# Patient Record
Sex: Male | Born: 1960 | Race: White | Hispanic: No | Marital: Married
Health system: Southern US, Community
[De-identification: ages and names within clinical notes are randomized; demographics above are authoritative.]

## PROBLEM LIST (undated history)

## (undated) DIAGNOSIS — R569 Unspecified convulsions: Secondary | ICD-10-CM

## (undated) DIAGNOSIS — R42 Dizziness and giddiness: Secondary | ICD-10-CM

## (undated) DIAGNOSIS — F3181 Bipolar II disorder: Secondary | ICD-10-CM

## (undated) DIAGNOSIS — M1990 Unspecified osteoarthritis, unspecified site: Secondary | ICD-10-CM

## (undated) DIAGNOSIS — F419 Anxiety disorder, unspecified: Secondary | ICD-10-CM

## (undated) DIAGNOSIS — F1910 Other psychoactive substance abuse, uncomplicated: Secondary | ICD-10-CM

## (undated) DIAGNOSIS — R51 Headache: Secondary | ICD-10-CM

## (undated) HISTORY — DX: Dizziness and giddiness: R42

## (undated) HISTORY — DX: Headache: R51

---

## 2003-09-30 ENCOUNTER — Encounter: Admission: RE | Admit: 2003-09-30 | Discharge: 2003-12-29 | Payer: Self-pay | Admitting: Anesthesiology

## 2005-08-03 ENCOUNTER — Ambulatory Visit (HOSPITAL_COMMUNITY): Admission: RE | Admit: 2005-08-03 | Discharge: 2005-08-03 | Payer: Self-pay | Admitting: Family Medicine

## 2009-01-11 ENCOUNTER — Emergency Department (HOSPITAL_COMMUNITY): Admission: EM | Admit: 2009-01-11 | Discharge: 2009-01-11 | Payer: Self-pay | Admitting: Emergency Medicine

## 2009-07-21 ENCOUNTER — Inpatient Hospital Stay (HOSPITAL_COMMUNITY): Admission: EM | Admit: 2009-07-21 | Discharge: 2009-08-01 | Payer: Self-pay | Admitting: Emergency Medicine

## 2009-09-18 ENCOUNTER — Encounter: Admission: RE | Admit: 2009-09-18 | Discharge: 2009-11-03 | Payer: Self-pay | Admitting: Orthopedic Surgery

## 2010-05-21 ENCOUNTER — Emergency Department (HOSPITAL_COMMUNITY): Admission: EM | Admit: 2010-05-21 | Discharge: 2010-05-21 | Source: Home / Self Care | Admitting: Emergency Medicine

## 2010-06-01 ENCOUNTER — Encounter
Admission: RE | Admit: 2010-06-01 | Discharge: 2010-06-01 | Payer: Self-pay | Source: Home / Self Care | Attending: Orthopedic Surgery | Admitting: Orthopedic Surgery

## 2010-08-10 LAB — CBC
HCT: 29.2 % — ABNORMAL LOW (ref 39.0–52.0)
HCT: 30.6 % — ABNORMAL LOW (ref 39.0–52.0)
HCT: 31.6 % — ABNORMAL LOW (ref 39.0–52.0)
HCT: 32.4 % — ABNORMAL LOW (ref 39.0–52.0)
HCT: 36.1 % — ABNORMAL LOW (ref 39.0–52.0)
Hemoglobin: 10.6 g/dL — ABNORMAL LOW (ref 13.0–17.0)
Hemoglobin: 10.9 g/dL — ABNORMAL LOW (ref 13.0–17.0)
Hemoglobin: 11.2 g/dL — ABNORMAL LOW (ref 13.0–17.0)
Hemoglobin: 11.5 g/dL — ABNORMAL LOW (ref 13.0–17.0)
Hemoglobin: 12.4 g/dL — ABNORMAL LOW (ref 13.0–17.0)
Hemoglobin: 14.2 g/dL (ref 13.0–17.0)
Hemoglobin: 17.6 g/dL — ABNORMAL HIGH (ref 13.0–17.0)
MCHC: 33.7 g/dL (ref 30.0–36.0)
MCHC: 34.3 g/dL (ref 30.0–36.0)
MCHC: 34.5 g/dL (ref 30.0–36.0)
MCHC: 34.8 g/dL (ref 30.0–36.0)
MCV: 98 fL (ref 78.0–100.0)
MCV: 98.1 fL (ref 78.0–100.0)
MCV: 98.2 fL (ref 78.0–100.0)
MCV: 98.5 fL (ref 78.0–100.0)
MCV: 98.8 fL (ref 78.0–100.0)
Platelets: 165 10*3/uL (ref 150–400)
Platelets: 172 10*3/uL (ref 150–400)
Platelets: 212 10*3/uL (ref 150–400)
Platelets: 237 10*3/uL (ref 150–400)
Platelets: 283 10*3/uL (ref 150–400)
Platelets: 304 10*3/uL (ref 150–400)
RBC: 3.06 MIL/uL — ABNORMAL LOW (ref 4.22–5.81)
RBC: 3.12 MIL/uL — ABNORMAL LOW (ref 4.22–5.81)
RBC: 3.22 MIL/uL — ABNORMAL LOW (ref 4.22–5.81)
RBC: 3.3 MIL/uL — ABNORMAL LOW (ref 4.22–5.81)
RBC: 3.66 MIL/uL — ABNORMAL LOW (ref 4.22–5.81)
RBC: 4.2 MIL/uL — ABNORMAL LOW (ref 4.22–5.81)
RDW: 13.5 % (ref 11.5–15.5)
RDW: 14.1 % (ref 11.5–15.5)
RDW: 14.5 % (ref 11.5–15.5)
WBC: 12.8 10*3/uL — ABNORMAL HIGH (ref 4.0–10.5)
WBC: 17 10*3/uL — ABNORMAL HIGH (ref 4.0–10.5)
WBC: 22.3 10*3/uL — ABNORMAL HIGH (ref 4.0–10.5)
WBC: 8.2 10*3/uL (ref 4.0–10.5)
WBC: 8.9 10*3/uL (ref 4.0–10.5)

## 2010-08-10 LAB — POCT I-STAT 3, ART BLOOD GAS (G3+)
O2 Saturation: 93 %
O2 Saturation: 93 %
TCO2: 25 mmol/L (ref 0–100)
pCO2 arterial: 38 mmHg (ref 35.0–45.0)
pCO2 arterial: 53.3 mmHg — ABNORMAL HIGH (ref 35.0–45.0)
pH, Arterial: 7.301 — ABNORMAL LOW (ref 7.350–7.450)
pO2, Arterial: 81 mmHg (ref 80.0–100.0)

## 2010-08-10 LAB — COMPREHENSIVE METABOLIC PANEL
ALT: 27 U/L (ref 0–53)
ALT: 36 U/L (ref 0–53)
AST: 21 U/L (ref 0–37)
AST: 25 U/L (ref 0–37)
Albumin: 2.4 g/dL — ABNORMAL LOW (ref 3.5–5.2)
Albumin: 3.7 g/dL (ref 3.5–5.2)
Alkaline Phosphatase: 101 U/L (ref 39–117)
Alkaline Phosphatase: 66 U/L (ref 39–117)
BUN: 8 mg/dL (ref 6–23)
CO2: 20 mEq/L (ref 19–32)
CO2: 24 mEq/L (ref 19–32)
CO2: 24 mEq/L (ref 19–32)
Calcium: 8.2 mg/dL — ABNORMAL LOW (ref 8.4–10.5)
Calcium: 8.9 mg/dL (ref 8.4–10.5)
Chloride: 106 mEq/L (ref 96–112)
Chloride: 111 mEq/L (ref 96–112)
Creatinine, Ser: 0.72 mg/dL (ref 0.4–1.5)
Creatinine, Ser: 1.1 mg/dL (ref 0.4–1.5)
GFR calc Af Amer: >60 mL/min (ref >60)
GFR calc Af Amer: >60 mL/min (ref >60)
GFR calc non Af Amer: >60 mL/min (ref >60)
Glucose, Bld: 107 mg/dL — ABNORMAL HIGH (ref 70–99)
Glucose, Bld: 134 mg/dL — ABNORMAL HIGH (ref 70–99)
Sodium: 141 mEq/L (ref 135–145)
Total Bilirubin: 0.3 mg/dL (ref 0.3–1.2)
Total Bilirubin: 0.6 mg/dL (ref 0.3–1.2)
Total Protein: 5.8 g/dL — ABNORMAL LOW (ref 6.0–8.3)
Total Protein: 7.3 g/dL (ref 6.0–8.3)

## 2010-08-10 LAB — GLUCOSE, CAPILLARY
Glucose-Capillary: 103 mg/dL — ABNORMAL HIGH (ref 70–99)
Glucose-Capillary: 122 mg/dL — ABNORMAL HIGH (ref 70–99)
Glucose-Capillary: 126 mg/dL — ABNORMAL HIGH (ref 70–99)
Glucose-Capillary: 129 mg/dL — ABNORMAL HIGH (ref 70–99)
Glucose-Capillary: 134 mg/dL — ABNORMAL HIGH (ref 70–99)
Glucose-Capillary: 134 mg/dL — ABNORMAL HIGH (ref 70–99)
Glucose-Capillary: 136 mg/dL — ABNORMAL HIGH (ref 70–99)
Glucose-Capillary: 141 mg/dL — ABNORMAL HIGH (ref 70–99)
Glucose-Capillary: 141 mg/dL — ABNORMAL HIGH (ref 70–99)
Glucose-Capillary: 147 mg/dL — ABNORMAL HIGH (ref 70–99)
Glucose-Capillary: 148 mg/dL — ABNORMAL HIGH (ref 70–99)
Glucose-Capillary: 155 mg/dL — ABNORMAL HIGH (ref 70–99)

## 2010-08-10 LAB — HEMOGLOBIN A1C: Hgb A1c MFr Bld: 5.6 % (ref 4.6–6.1)

## 2010-08-10 LAB — BASIC METABOLIC PANEL
BUN: 4 mg/dL — ABNORMAL LOW (ref 6–23)
BUN: 6 mg/dL (ref 6–23)
BUN: 7 mg/dL (ref 6–23)
BUN: 7 mg/dL (ref 6–23)
BUN: 8 mg/dL (ref 6–23)
BUN: 8 mg/dL (ref 6–23)
CO2: 21 mEq/L (ref 19–32)
CO2: 26 mEq/L (ref 19–32)
CO2: 29 mEq/L (ref 19–32)
CO2: 31 mEq/L (ref 19–32)
Calcium: 7.8 mg/dL — ABNORMAL LOW (ref 8.4–10.5)
Calcium: 8.1 mg/dL — ABNORMAL LOW (ref 8.4–10.5)
Calcium: 8.1 mg/dL — ABNORMAL LOW (ref 8.4–10.5)
Calcium: 8.3 mg/dL — ABNORMAL LOW (ref 8.4–10.5)
Chloride: 104 mEq/L (ref 96–112)
Chloride: 105 mEq/L (ref 96–112)
Chloride: 110 mEq/L (ref 96–112)
Creatinine, Ser: 0.61 mg/dL (ref 0.4–1.5)
Creatinine, Ser: 0.64 mg/dL (ref 0.4–1.5)
Creatinine, Ser: 0.66 mg/dL (ref 0.4–1.5)
Creatinine, Ser: 0.89 mg/dL (ref 0.4–1.5)
GFR calc Af Amer: >60 mL/min (ref >60)
GFR calc Af Amer: >60 mL/min (ref >60)
GFR calc non Af Amer: >60 mL/min (ref >60)
GFR calc non Af Amer: >60 mL/min (ref >60)
GFR calc non Af Amer: >60 mL/min (ref >60)
Glucose, Bld: 114 mg/dL — ABNORMAL HIGH (ref 70–99)
Glucose, Bld: 121 mg/dL — ABNORMAL HIGH (ref 70–99)
Glucose, Bld: 139 mg/dL — ABNORMAL HIGH (ref 70–99)
Glucose, Bld: 173 mg/dL — ABNORMAL HIGH (ref 70–99)
Glucose, Bld: 97 mg/dL (ref 70–99)
Potassium: 3.2 mEq/L — ABNORMAL LOW (ref 3.5–5.1)
Potassium: 3.2 mEq/L — ABNORMAL LOW (ref 3.5–5.1)
Potassium: 4 mEq/L (ref 3.5–5.1)
Sodium: 137 mEq/L (ref 135–145)
Sodium: 142 mEq/L (ref 135–145)
Sodium: 142 mEq/L (ref 135–145)

## 2010-08-10 LAB — EXPECTORATED SPUTUM ASSESSMENT W GRAM STAIN, RFLX TO RESP C

## 2010-08-10 LAB — PROTIME-INR
INR: 1.07 (ref 0.00–1.49)
INR: 1.16 (ref 0.00–1.49)
Prothrombin Time: 13.8 seconds (ref 11.6–15.2)

## 2010-08-10 LAB — CROSSMATCH
ABO/RH(D): B POS
Antibody Screen: NEGATIVE

## 2010-08-10 LAB — APTT: aPTT: 35 seconds (ref 24–37)

## 2010-08-10 LAB — CULTURE, RESPIRATORY W GRAM STAIN: Culture: NORMAL

## 2010-08-10 LAB — PHOSPHORUS: Phosphorus: 2.8 mg/dL (ref 2.3–4.6)

## 2010-08-10 LAB — MAGNESIUM: Magnesium: 2.1 mg/dL (ref 1.5–2.5)

## 2010-08-10 LAB — MRSA PCR SCREENING: MRSA by PCR: NEGATIVE

## 2010-08-10 LAB — POCT I-STAT, CHEM 8
Creatinine, Ser: 0.9 mg/dL (ref 0.4–1.5)
Potassium: 3.5 mEq/L (ref 3.5–5.1)

## 2010-08-22 LAB — POCT I-STAT, CHEM 8
Calcium, Ion: 1.06 mmol/L — ABNORMAL LOW (ref 1.12–1.32)
Hemoglobin: 14.6 g/dL (ref 13.0–17.0)
Sodium: 143 mEq/L (ref 135–145)
TCO2: 18 mmol/L (ref 0–100)

## 2010-08-22 LAB — URINALYSIS, ROUTINE W REFLEX MICROSCOPIC
Ketones, ur: NEGATIVE mg/dL
Nitrite: NEGATIVE
Protein, ur: NEGATIVE mg/dL
Urobilinogen, UA: 0.2 mg/dL (ref 0.0–1.0)
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
## 2011-03-04 ENCOUNTER — Emergency Department (HOSPITAL_COMMUNITY): Payer: 59

## 2011-03-04 ENCOUNTER — Emergency Department (HOSPITAL_COMMUNITY)
Admission: EM | Admit: 2011-03-04 | Discharge: 2011-03-04 | Disposition: A | Discharge status: Home / Self Care | Payer: 59 | Attending: Emergency Medicine | Admitting: Emergency Medicine

## 2011-03-04 DIAGNOSIS — M546 Pain in thoracic spine: Secondary | ICD-10-CM | POA: Insufficient documentation

## 2011-03-04 DIAGNOSIS — R42 Dizziness and giddiness: Secondary | ICD-10-CM | POA: Insufficient documentation

## 2011-03-04 DIAGNOSIS — E119 Type 2 diabetes mellitus without complications: Secondary | ICD-10-CM | POA: Insufficient documentation

## 2011-03-04 DIAGNOSIS — M25519 Pain in unspecified shoulder: Secondary | ICD-10-CM | POA: Insufficient documentation

## 2011-03-04 DIAGNOSIS — R61 Generalized hyperhidrosis: Secondary | ICD-10-CM | POA: Insufficient documentation

## 2011-03-04 DIAGNOSIS — Z79899 Other long term (current) drug therapy: Secondary | ICD-10-CM | POA: Insufficient documentation

## 2011-03-04 DIAGNOSIS — G9050 Complex regional pain syndrome I, unspecified: Secondary | ICD-10-CM | POA: Insufficient documentation

## 2011-03-04 DIAGNOSIS — R55 Syncope and collapse: Secondary | ICD-10-CM | POA: Insufficient documentation

## 2011-03-04 DIAGNOSIS — R079 Chest pain, unspecified: Secondary | ICD-10-CM | POA: Insufficient documentation

## 2011-03-04 LAB — CBC
Hemoglobin: 16.1 g/dL (ref 13.0–17.0)
MCH: 34.6 pg — ABNORMAL HIGH (ref 26.0–34.0)
MCHC: 35.2 g/dL (ref 30.0–36.0)
Platelets: 207 10*3/uL (ref 150–400)
RBC: 4.65 MIL/uL (ref 4.22–5.81)

## 2011-03-04 LAB — POCT I-STAT TROPONIN I
Troponin i, poc: 0.02 ng/mL (ref 0.00–0.08)
Troponin i, poc: 0 ng/mL (ref 0.00–0.08)

## 2011-03-04 LAB — DIFFERENTIAL
Basophils Absolute: 0.1 10*3/uL (ref 0.0–0.1)
Basophils Relative: 1 % (ref 0–1)
Eosinophils Absolute: 0.2 10*3/uL (ref 0.0–0.7)
Monocytes Absolute: 0.6 10*3/uL (ref 0.1–1.0)
Monocytes Relative: 6 % (ref 3–12)
Neutro Abs: 5.3 10*3/uL (ref 1.7–7.7)
Neutrophils Relative %: 58 % (ref 43–77)

## 2011-03-04 LAB — GLUCOSE, CAPILLARY: Glucose-Capillary: 111 mg/dL — ABNORMAL HIGH (ref 70–99)

## 2011-03-04 LAB — COMPREHENSIVE METABOLIC PANEL
AST: 17 U/L (ref 0–37)
Albumin: 4 g/dL (ref 3.5–5.2)
BUN: 10 mg/dL (ref 6–23)
Chloride: 106 mEq/L (ref 96–112)
Creatinine, Ser: 0.86 mg/dL (ref 0.50–1.35)
Total Protein: 6.9 g/dL (ref 6.0–8.3)

## 2011-03-17 ENCOUNTER — Encounter (HOSPITAL_COMMUNITY): Payer: Self-pay | Admitting: Pharmacy Technician

## 2011-03-18 ENCOUNTER — Other Ambulatory Visit (HOSPITAL_COMMUNITY): Payer: Self-pay | Admitting: Orthopedic Surgery

## 2011-03-18 ENCOUNTER — Encounter (HOSPITAL_COMMUNITY): Payer: Self-pay

## 2011-03-18 ENCOUNTER — Encounter (HOSPITAL_COMMUNITY)
Admission: RE | Admit: 2011-03-18 | Discharge: 2011-03-18 | Disposition: A | Discharge status: Home / Self Care | Payer: 59 | Source: Ambulatory Visit | Attending: Orthopedic Surgery | Admitting: Orthopedic Surgery

## 2011-03-18 ENCOUNTER — Ambulatory Visit (HOSPITAL_COMMUNITY): Admission: RE | Admit: 2011-03-18 | Payer: 59 | Source: Ambulatory Visit

## 2011-03-18 DIAGNOSIS — M1712 Unilateral primary osteoarthritis, left knee: Secondary | ICD-10-CM

## 2011-03-18 HISTORY — PX: MANDIBLE FRACTURE SURGERY: SHX706

## 2011-03-18 HISTORY — DX: Anxiety disorder, unspecified: F41.9

## 2011-03-18 HISTORY — PX: KNEE ARTHROSCOPY: SUR90

## 2011-03-18 HISTORY — DX: Unspecified osteoarthritis, unspecified site: M19.90

## 2011-03-18 LAB — URINALYSIS, ROUTINE W REFLEX MICROSCOPIC
Glucose, UA: NEGATIVE mg/dL
Ketones, ur: NEGATIVE mg/dL
Leukocytes, UA: NEGATIVE
Protein, ur: NEGATIVE mg/dL
Urobilinogen, UA: 1 mg/dL (ref 0.0–1.0)

## 2011-03-18 LAB — COMPREHENSIVE METABOLIC PANEL
AST: 16 U/L (ref 0–37)
CO2: 23 mEq/L (ref 19–32)
Calcium: 9.7 mg/dL (ref 8.4–10.5)
Creatinine, Ser: 0.78 mg/dL (ref 0.50–1.35)
GFR calc Af Amer: >90 mL/min (ref >90)
GFR calc non Af Amer: >90 mL/min (ref >90)

## 2011-03-18 LAB — CBC
HCT: 47.9 % (ref 39.0–52.0)
MCH: 34.6 pg — ABNORMAL HIGH (ref 26.0–34.0)
MCHC: 35.1 g/dL (ref 30.0–36.0)
MCV: 98.8 fL (ref 78.0–100.0)
Platelets: 228 10*3/uL (ref 150–400)
RDW: 13.3 % (ref 11.5–15.5)
WBC: 9.5 10*3/uL (ref 4.0–10.5)

## 2011-03-18 LAB — APTT: aPTT: 43 seconds — ABNORMAL HIGH (ref 24–37)

## 2011-03-18 LAB — DIFFERENTIAL
Eosinophils Absolute: 0.1 10*3/uL (ref 0.0–0.7)
Eosinophils Relative: 1 % (ref 0–5)
Lymphocytes Relative: 27 % (ref 12–46)
Lymphs Abs: 2.6 10*3/uL (ref 0.7–4.0)
Monocytes Absolute: 0.4 10*3/uL (ref 0.1–1.0)

## 2011-03-18 LAB — PROTIME-INR: INR: 1 (ref 0.00–1.49)

## 2011-03-18 NOTE — Pre-Procedure Instructions (Signed)
[address]  03/18/2011   Your procedure is scheduled on:  03-29-2011 @ 845am  Report to [facility] [id number] AM.  Call this number if you have problems the morning of surgery: [phone]   Remember:   Do not eat food:After Midnight.  Do not drink clear liquids: 4 Hours before arrival.  Take these medicines the morning of surgery with A SIP OF WATER: xanax, oxycontin,soma,topamax    Do not wear jewelry, make-up or nail polish.  Do not wear lotions, powders, or perfumes. You may wear deodorant.  Do not shave 48 hours prior to surgery.  Do not bring valuables to the hospital.  Contacts, dentures or bridgework may not be worn into surgery.  Leave suitcase in the car. After surgery it may be brought to your room.  For patients admitted to the hospital, checkout time is 11:00 AM the day of discharge.   Patients discharged the day of surgery will not be allowed to drive home.  Name and phone number of your driver Cody Blake  [phone]  Special Instructions: Incentive Spirometry - Practice and bring it with you on the day of surgery.   Please read over the following fact sheets that you were given: Blood Transfusion Information, MRSA Information and Surgical Site Infection Prevention

## 2011-03-19 LAB — URINE CULTURE: Colony Count: 50000

## 2011-03-26 ENCOUNTER — Encounter (HOSPITAL_COMMUNITY): Payer: Self-pay | Admitting: Vascular Surgery

## 2011-03-26 NOTE — Consult Note (Signed)
Anesthesia:  Labs/CXR/EKG/Hx/Meds reviewed.  Plan to proceed.

## 2011-03-28 MED ORDER — CEFAZOLIN SODIUM 1-5 GM-% IV SOLN
1.0000 g | INTRAVENOUS | Status: AC
  Administered 2011-03-29: 1 g via INTRAVENOUS
  Filled 2011-03-28: qty 50

## 2011-03-29 ENCOUNTER — Encounter (HOSPITAL_COMMUNITY): Payer: Self-pay | Admitting: Vascular Surgery

## 2011-03-29 ENCOUNTER — Inpatient Hospital Stay (HOSPITAL_COMMUNITY)
Admission: RE | Admit: 2011-03-29 | Discharge: 2011-03-31 | DRG: 470 | Disposition: A | Discharge status: Home Health | Payer: 59 | Source: Ambulatory Visit | Attending: Orthopedic Surgery | Admitting: Orthopedic Surgery

## 2011-03-29 ENCOUNTER — Inpatient Hospital Stay (HOSPITAL_COMMUNITY): Payer: 59 | Admitting: Vascular Surgery

## 2011-03-29 ENCOUNTER — Encounter (HOSPITAL_COMMUNITY): Payer: Self-pay | Admitting: *Deleted

## 2011-03-29 ENCOUNTER — Encounter (HOSPITAL_COMMUNITY)
Admission: RE | Disposition: A | Discharge status: Home Health | Payer: Self-pay | Source: Ambulatory Visit | Attending: Orthopedic Surgery

## 2011-03-29 DIAGNOSIS — F411 Generalized anxiety disorder: Secondary | ICD-10-CM | POA: Diagnosis present

## 2011-03-29 DIAGNOSIS — Z01818 Encounter for other preprocedural examination: Secondary | ICD-10-CM

## 2011-03-29 DIAGNOSIS — I10 Essential (primary) hypertension: Secondary | ICD-10-CM | POA: Diagnosis present

## 2011-03-29 DIAGNOSIS — M1710 Unilateral primary osteoarthritis, unspecified knee: Principal | ICD-10-CM | POA: Diagnosis present

## 2011-03-29 DIAGNOSIS — F17200 Nicotine dependence, unspecified, uncomplicated: Secondary | ICD-10-CM | POA: Diagnosis present

## 2011-03-29 DIAGNOSIS — D62 Acute posthemorrhagic anemia: Secondary | ICD-10-CM | POA: Diagnosis not present

## 2011-03-29 DIAGNOSIS — Z01812 Encounter for preprocedural laboratory examination: Secondary | ICD-10-CM

## 2011-03-29 HISTORY — PX: TOTAL KNEE ARTHROPLASTY: SHX125

## 2011-03-29 LAB — TYPE AND SCREEN: ABO/RH(D): B POS

## 2011-03-29 SURGERY — ARTHROPLASTY, KNEE, TOTAL
Anesthesia: Regional | Site: Knee | Laterality: Left | Wound class: Clean

## 2011-03-29 MED ORDER — BUPIVACAINE ON-Q PAIN PUMP (FOR ORDER SET NO CHG)
INJECTION | Status: DC
  Filled 2011-03-29: qty 1

## 2011-03-29 MED ORDER — ONDANSETRON HCL 4 MG/2ML IJ SOLN: 4.0000 mg | Freq: Once | INTRAMUSCULAR | Status: DC | PRN

## 2011-03-29 MED ORDER — SENNOSIDES-DOCUSATE SODIUM 8.6-50 MG PO TABS
1.0000 | ORAL_TABLET | Freq: Every evening | ORAL | Status: DC | PRN
  Filled 2011-03-29: qty 1

## 2011-03-29 MED ORDER — PROPOFOL 10 MG/ML IV EMUL
INTRAVENOUS | Status: DC | PRN
  Administered 2011-03-29: 200 mg via INTRAVENOUS

## 2011-03-29 MED ORDER — ONDANSETRON HCL 4 MG/2ML IJ SOLN: 4.0000 mg | Freq: Four times a day (QID) | INTRAMUSCULAR | Status: DC | PRN

## 2011-03-29 MED ORDER — ROPINIROLE HCL 1 MG PO TABS
4.0000 mg | ORAL_TABLET | Freq: Every day | ORAL | Status: DC
Start: 2011-03-29 — End: 2011-03-31
  Administered 2011-03-29 – 2011-03-30 (×2): 4 mg via ORAL
  Filled 2011-03-29 (×3): qty 4

## 2011-03-29 MED ORDER — FENTANYL CITRATE 0.05 MG/ML IJ SOLN
INTRAMUSCULAR | Status: DC | PRN
  Administered 2011-03-29 (×2): 50 ug via INTRAVENOUS
  Administered 2011-03-29: 200 ug via INTRAVENOUS
  Administered 2011-03-29 (×3): 50 ug via INTRAVENOUS
  Administered 2011-03-29: 100 ug via INTRAVENOUS
  Administered 2011-03-29: 50 ug via INTRAVENOUS

## 2011-03-29 MED ORDER — ENOXAPARIN SODIUM 30 MG/0.3ML ~~LOC~~ SOLN
30.0000 mg | Freq: Two times a day (BID) | SUBCUTANEOUS | Status: DC
  Administered 2011-03-29 – 2011-03-31 (×4): 30 mg via SUBCUTANEOUS
  Filled 2011-03-29 (×6): qty 0.3

## 2011-03-29 MED ORDER — OXYCODONE HCL 20 MG PO TB12
20.0000 mg | ORAL_TABLET | Freq: Two times a day (BID) | ORAL | Status: DC
  Administered 2011-03-29 – 2011-03-31 (×5): 20 mg via ORAL
  Filled 2011-03-29 (×5): qty 1

## 2011-03-29 MED ORDER — FLEET ENEMA 7-19 GM/118ML RE ENEM
1.0000 | ENEMA | Freq: Every day | RECTAL | Status: DC | PRN
  Filled 2011-03-29: qty 1

## 2011-03-29 MED ORDER — BUPIVACAINE 0.25 % ON-Q PUMP SINGLE CATH 300ML: INJECTION | Status: DC | PRN

## 2011-03-29 MED ORDER — DIPHENHYDRAMINE HCL 12.5 MG/5ML PO ELIX
12.5000 mg | ORAL_SOLUTION | ORAL | Status: DC | PRN
  Administered 2011-03-31: 25 mg via ORAL
  Filled 2011-03-29: qty 10

## 2011-03-29 MED ORDER — BUPIVACAINE 0.25 % ON-Q PUMP SINGLE CATH 300ML
300.0000 mL | INJECTION | Status: DC
  Filled 2011-03-29: qty 300

## 2011-03-29 MED ORDER — METHOCARBAMOL 500 MG PO TABS
500.0000 mg | ORAL_TABLET | Freq: Four times a day (QID) | ORAL | Status: DC | PRN
  Administered 2011-03-30 – 2011-03-31 (×4): 500 mg via ORAL
  Filled 2011-03-29 (×4): qty 1

## 2011-03-29 MED ORDER — SODIUM CHLORIDE 0.9 % IV SOLN
INTRAVENOUS | Status: DC
  Administered 2011-03-30 – 2011-03-31 (×3): via INTRAVENOUS

## 2011-03-29 MED ORDER — HYDROMORPHONE HCL PF 1 MG/ML IJ SOLN
0.5000 mg | INTRAMUSCULAR | Status: DC | PRN
  Administered 2011-03-29 – 2011-03-31 (×16): 1 mg via INTRAVENOUS
  Filled 2011-03-29 (×16): qty 1

## 2011-03-29 MED ORDER — MIDAZOLAM HCL 5 MG/5ML IJ SOLN
INTRAMUSCULAR | Status: DC | PRN
  Administered 2011-03-29: 2 mg via INTRAVENOUS

## 2011-03-29 MED ORDER — ONDANSETRON HCL 4 MG/2ML IJ SOLN
INTRAMUSCULAR | Status: DC | PRN
  Administered 2011-03-29: 4 mg via INTRAVENOUS

## 2011-03-29 MED ORDER — SODIUM CHLORIDE 0.9 % IR SOLN
Status: DC | PRN
  Administered 2011-03-29: 1000 mL

## 2011-03-29 MED ORDER — GLYCOPYRROLATE 0.2 MG/ML IJ SOLN
INTRAMUSCULAR | Status: DC | PRN
  Administered 2011-03-29: .6 mg via INTRAVENOUS

## 2011-03-29 MED ORDER — METOCLOPRAMIDE HCL 5 MG PO TABS
5.0000 mg | ORAL_TABLET | Freq: Three times a day (TID) | ORAL | Status: DC | PRN
  Filled 2011-03-29 (×2): qty 2

## 2011-03-29 MED ORDER — DOCUSATE SODIUM 100 MG PO CAPS
100.0000 mg | ORAL_CAPSULE | Freq: Two times a day (BID) | ORAL | Status: DC
  Administered 2011-03-29 – 2011-03-31 (×4): 100 mg via ORAL
  Filled 2011-03-29 (×6): qty 1

## 2011-03-29 MED ORDER — OXYCODONE HCL 5 MG PO TABS
5.0000 mg | ORAL_TABLET | ORAL | Status: DC | PRN
  Administered 2011-03-29 – 2011-03-31 (×12): 10 mg via ORAL
  Filled 2011-03-29 (×12): qty 2

## 2011-03-29 MED ORDER — DEXAMETHASONE SODIUM PHOSPHATE 4 MG/ML IJ SOLN
INTRAMUSCULAR | Status: DC | PRN
  Administered 2011-03-29: 4 mg via INTRAVENOUS

## 2011-03-29 MED ORDER — NEOSTIGMINE METHYLSULFATE 1 MG/ML IJ SOLN
INTRAMUSCULAR | Status: DC | PRN
  Administered 2011-03-29: 4 mg via INTRAVENOUS

## 2011-03-29 MED ORDER — BUPIVACAINE-EPINEPHRINE PF 0.25-1:200000 % IJ SOLN
INTRAMUSCULAR | Status: DC | PRN
  Administered 2011-03-29: 20 mL

## 2011-03-29 MED ORDER — MORPHINE SULFATE 10 MG/ML IJ SOLN
INTRAMUSCULAR | Status: DC | PRN
  Administered 2011-03-29: 2 mg via INTRAVENOUS
  Administered 2011-03-29 (×2): 4 mg via INTRAVENOUS

## 2011-03-29 MED ORDER — ALPRAZOLAM 0.5 MG PO TABS
1.0000 mg | ORAL_TABLET | Freq: Three times a day (TID) | ORAL | Status: DC
Start: 2011-03-29 — End: 2011-03-31
  Administered 2011-03-29 – 2011-03-31 (×6): 1 mg via ORAL
  Filled 2011-03-29: qty 2
  Filled 2011-03-29 (×5): qty 1
  Filled 2011-03-29 (×2): qty 2
  Filled 2011-03-29: qty 1

## 2011-03-29 MED ORDER — METHOCARBAMOL 100 MG/ML IJ SOLN
500.0000 mg | Freq: Four times a day (QID) | INTRAVENOUS | Status: DC | PRN
  Administered 2011-03-29: 500 mg via INTRAVENOUS
  Filled 2011-03-29: qty 5

## 2011-03-29 MED ORDER — METOCLOPRAMIDE HCL 5 MG/ML IJ SOLN
5.0000 mg | Freq: Three times a day (TID) | INTRAMUSCULAR | Status: DC | PRN
  Filled 2011-03-29: qty 2

## 2011-03-29 MED ORDER — WHITE PETROLATUM GEL
Status: AC
  Filled 2011-03-29: qty 5

## 2011-03-29 MED ORDER — TOPIRAMATE 100 MG PO TABS
100.0000 mg | ORAL_TABLET | Freq: Three times a day (TID) | ORAL | Status: DC
Start: 2011-03-29 — End: 2011-03-31
  Administered 2011-03-29 – 2011-03-31 (×6): 100 mg via ORAL
  Filled 2011-03-29 (×10): qty 1

## 2011-03-29 MED ORDER — HYDROMORPHONE HCL PF 1 MG/ML IJ SOLN
0.2500 mg | INTRAMUSCULAR | Status: DC | PRN
  Administered 2011-03-29 (×2): 0.5 mg via INTRAVENOUS

## 2011-03-29 MED ORDER — CARISOPRODOL 350 MG PO TABS
350.0000 mg | ORAL_TABLET | Freq: Four times a day (QID) | ORAL | Status: DC
Start: 2011-03-29 — End: 2011-03-31
  Administered 2011-03-29 – 2011-03-31 (×7): 350 mg via ORAL
  Filled 2011-03-29 (×9): qty 1

## 2011-03-29 MED ORDER — LACTATED RINGERS IV SOLN
INTRAVENOUS | Status: DC | PRN
  Administered 2011-03-29 (×2): via INTRAVENOUS

## 2011-03-29 MED ORDER — ACETAMINOPHEN 10 MG/ML IV SOLN
1000.0000 mg | Freq: Four times a day (QID) | INTRAVENOUS | Status: DC
  Administered 2011-03-29: 1000 mg via INTRAVENOUS
  Filled 2011-03-29 (×3): qty 100

## 2011-03-29 MED ORDER — ZOLPIDEM TARTRATE 5 MG PO TABS
5.0000 mg | ORAL_TABLET | Freq: Every evening | ORAL | Status: DC | PRN
  Filled 2011-03-29: qty 1

## 2011-03-29 MED ORDER — ROCURONIUM BROMIDE 100 MG/10ML IV SOLN
INTRAVENOUS | Status: DC | PRN
  Administered 2011-03-29: 40 mg via INTRAVENOUS
  Administered 2011-03-29: 10 mg via INTRAVENOUS

## 2011-03-29 MED ORDER — CEFAZOLIN SODIUM 1-5 GM-% IV SOLN
1.0000 g | Freq: Four times a day (QID) | INTRAVENOUS | Status: AC
  Administered 2011-03-29 (×2): 1 g via INTRAVENOUS
  Filled 2011-03-29 (×2): qty 50

## 2011-03-29 SURGICAL SUPPLY — 57 items
BANDAGE ESMARK 6X9 LF (GAUZE/BANDAGES/DRESSINGS) ×1 IMPLANT
BLADE SAGITTAL 13X1.27X60 (BLADE) ×2 IMPLANT
BLADE SAW SGTL 83.5X18.5 (BLADE) ×2 IMPLANT
BNDG CMPR 9X6 STRL LF SNTH (GAUZE/BANDAGES/DRESSINGS) ×1
BNDG ESMARK 6X9 LF (GAUZE/BANDAGES/DRESSINGS) ×2
BOWL SMART MIX CTS (DISPOSABLE) ×2 IMPLANT
CATH KIT ON Q 10IN SLV (PAIN MANAGEMENT) ×2 IMPLANT
CEMENT BONE SIMPLEX SPEEDSET (Cement) ×3 IMPLANT
CLOTH BEACON ORANGE TIMEOUT ST (SAFETY) ×2 IMPLANT
COVER BACK TABLE 24X17X13 BIG (DRAPES) ×1 IMPLANT
COVER SURGICAL LIGHT HANDLE (MISCELLANEOUS) ×2 IMPLANT
CUFF TOURNIQUET SINGLE 34IN LL (TOURNIQUET CUFF) ×2 IMPLANT
DRAPE EXTREMITY T 121X128X90 (DRAPE) ×2 IMPLANT
DRAPE INCISE IOBAN 66X45 STRL (DRAPES) ×5 IMPLANT
DRAPE PROXIMA HALF (DRAPES) ×2 IMPLANT
DRAPE U-SHAPE 47X51 STRL (DRAPES) ×2 IMPLANT
DRSG ADAPTIC 3X8 NADH LF (GAUZE/BANDAGES/DRESSINGS) ×2 IMPLANT
DRSG PAD ABDOMINAL 8X10 ST (GAUZE/BANDAGES/DRESSINGS) ×2 IMPLANT
DURAPREP 26ML APPLICATOR (WOUND CARE) ×4 IMPLANT
ELECT REM PT RETURN 9FT ADLT (ELECTROSURGICAL) ×2
ELECTRODE REM PT RTRN 9FT ADLT (ELECTROSURGICAL) ×1 IMPLANT
EVACUATOR 1/8 PVC DRAIN (DRAIN) ×2 IMPLANT
GLOVE BIOGEL M 7.0 STRL (GLOVE) ×1 IMPLANT
GLOVE BIOGEL PI IND STRL 7.5 (GLOVE) IMPLANT
GLOVE BIOGEL PI IND STRL 8.5 (GLOVE) ×2 IMPLANT
GLOVE BIOGEL PI INDICATOR 7.5 (GLOVE) ×1
GLOVE BIOGEL PI INDICATOR 8.5 (GLOVE) ×1
GLOVE SURG ORTHO 8.0 STRL STRW (GLOVE) ×4 IMPLANT
GOWN PREVENTION PLUS XLARGE (GOWN DISPOSABLE) ×4 IMPLANT
GOWN STRL NON-REIN LRG LVL3 (GOWN DISPOSABLE) ×4 IMPLANT
HANDPIECE INTERPULSE COAX TIP (DISPOSABLE) ×2
HOOD PEEL AWAY FACE SHEILD DIS (HOOD) ×7 IMPLANT
KIT BASIN OR (CUSTOM PROCEDURE TRAY) ×2 IMPLANT
KIT ROOM TURNOVER OR (KITS) ×2 IMPLANT
MANIFOLD NEPTUNE II (INSTRUMENTS) ×2 IMPLANT
NEEDLE 22X1 1/2 (OR ONLY) (NEEDLE) IMPLANT
NS IRRIG 1000ML POUR BTL (IV SOLUTION) ×2 IMPLANT
PACK TOTAL JOINT (CUSTOM PROCEDURE TRAY) ×2 IMPLANT
PAD ARMBOARD 7.5X6 YLW CONV (MISCELLANEOUS) ×2 IMPLANT
PADDING CAST COTTON 6X4 STRL (CAST SUPPLIES) ×2 IMPLANT
POSITIONER HEAD PRONE TRACH (MISCELLANEOUS) ×2 IMPLANT
SET HNDPC FAN SPRY TIP SCT (DISPOSABLE) ×1 IMPLANT
SPONGE GAUZE 4X4 12PLY (GAUZE/BANDAGES/DRESSINGS) ×2 IMPLANT
SPONGE GAUZE 4X4 STERILE 39 (GAUZE/BANDAGES/DRESSINGS) ×1 IMPLANT
STAPLER VISISTAT 35W (STAPLE) ×2 IMPLANT
SUCTION FRAZIER TIP 10 FR DISP (SUCTIONS) ×2 IMPLANT
SUT BONE WAX W31G (SUTURE) ×2 IMPLANT
SUT VIC AB 0 CTB1 27 (SUTURE) ×4 IMPLANT
SUT VIC AB 1 CT1 27 (SUTURE) ×8
SUT VIC AB 1 CT1 27XBRD ANBCTR (SUTURE) ×4 IMPLANT
SUT VIC AB 2-0 CT1 27 (SUTURE) ×4
SUT VIC AB 2-0 CT1 TAPERPNT 27 (SUTURE) ×2 IMPLANT
SYR CONTROL 10ML LL (SYRINGE) ×1 IMPLANT
TOWEL OR 17X24 6PK STRL BLUE (TOWEL DISPOSABLE) ×2 IMPLANT
TOWEL OR 17X26 10 PK STRL BLUE (TOWEL DISPOSABLE) ×2 IMPLANT
TRAY FOLEY CATH 14FR (SET/KITS/TRAYS/PACK) ×1 IMPLANT
WATER STERILE IRR 1000ML POUR (IV SOLUTION) ×6 IMPLANT

## 2011-03-29 NOTE — Op Note (Signed)
TOTAL KNEE REPLACEMENT OPERATIVE NOTE:  03/29/2011  10:44 AM  PATIENT:  Cody Blake  50 y.o. male  PRE-OPERATIVE DIAGNOSIS:  OSTEOARTHRITIS LEFT KNEE  POST-OPERATIVE DIAGNOSIS:  OSTEOARTHRITIS LEFT KNEE  PROCEDURE:  Procedure(s): TOTAL KNEE ARTHROPLASTY  SURGEON:  Surgeon(s): Raymon Mutton, MD  PHYSICIAN ASSISTANT: Altamese Cabal, [facility]  ANESTHESIA:   general  DRAINS: Hemovac and On-Q Marcaine Pain Pump  SPECIMEN: None  COUNTS:  Correct  TOURNIQUET:   Total Tourniquet Time Documented: Thigh (Left) - 64 minutes  DICTATION:  Indication for procedure:    The patient is a 50 y.o. male who has failed conservative treatment for OSTEOARTHRITIS LEFT KNEE.  Informed consent was obtained prior to anesthesia. The risks versus benefits of the operation were explain and in a way the patient can, and did, understand.   Description of procedure:     The patient was taken to the operating room and placed under anesthesia.  The patient was positioned in the usual fashion taking care that all body parts were adequately padded and/or protected.  I foley catheter was not placed.  A tourniquet was applied and the leg prepped and draped in the usual sterile fashion.  The extremity was exsanguinated with the esmarch and tourniquet inflated to 350 mmHg.  Pre-operative range of motion was 5-125 degrees range of motion.  The knee was in 1 degree of neutral.  A midline incision approximately 6-7 inches long was made with a #10 blade.  A new blade was used to make a parapatellar arthrotomy going 2-3 cm into the quadriceps tendon, over the patella, and alongside the medial aspect of the patellar tendon.  A synovectomy was then performed with the #10 blade and forceps. I then elevated the deep MCL off the medial tibial metaphysis subperiosteally around to the semimembranosus attachment.    I everted the patella and used calipers to measure patellar thickness, which was ###.  I used the reamer to ream  down to appropriate thickness to recreated the native thickness.  I then removed excess bone with the rongeur and sagittal saw.  I used the ## mm template and drilled the three lug holes.  I then put the trial in place and measured the thickness with the calipers to ensure recreation of the native thickness.  The trial was then removed and the patella subluxed and the knee brought into flexion.  A homan retractor was place to retract and protect the patella and lateral structures.  A Z-retractor was place medially to protect the medial structures.  The extra-medullary alignment system was used to make cut the tibial articular surface perpendicular to the anamotic axis of the tibia and in 3 degrees of posterior slope.  The cut surface and alignment jig was removed.  I then used the intramedullary alignment guide to make a 3 valgus cut on the distal femur.  I then marked out the epicondylar axis on the distal femur.  The posterior condylar axis measured 3 degrees.  I then used the anterior referencing sizer and measured the femur to be a size E.  The 4-In-1 cutting block was screwed into place in external rotation matching the posterior condylar angle, making our cuts perpendicular to the epicondylar axis.  Anterior, posterior and chamfer cuts were made with the sagittal saw.  The cutting block and cut pieces were removed.  A lamina spreader was placed in 90 degrees of flexion.  The ACL, PCL, menisci, and posterior condylar osteophytes were removed.  A 10 mm spacer blocked  was found to offer good flexion and extension gap balance after moderate in degree releasing.   The scoop retractor was then placed and the femoral finishing block was pinned in place.  The small sagittal saw was used as well as the lug drill to finish the femur.  The block and cut surfaces were removed and the medullary canal hole filled with autograft bone from the cut pieces.  The tibia was delivered forward in deep flexion and external  rotation.  A size 5 tray was selected and pinned into place centered on the medial 1/3 of the tibial tubercle.  The reamer and keel was used to prepare the tibia through the tray.    I then trialed with the size E femur, size 5 tibia, a 10 mm insert and the 35 patella.  I had excellent flexion/extension gap balance, excellent patella tracking.  Flexion was full and beyond 120 degrees; extension was zero.  These components were chosen and the staff opened them to me on the back table while the knee was lavaged copiously and the cement mixed.  I cemented in the components and removed all excess cement.  The polyethylene tibial component was snapped into place and the knee placed in extension while cement was hardening.  The capsule was infilltrated with 20cc of .25% Marcaine with epinephrine.  A hemovac was place in the joint exiting superolaterally.  A pain pump was place superomedially superficial to the arthrotomy.  Once the cement was hard, the tourniquet was let down.  Hemostasis was obtained.  The arthrotomy was closed with figure-8 #1 vicryl sutures.  The deep soft tissues were closed with #0 vicryls and the subcuticular layer closed with a running #2-0 vicryl.  The skin was reapproximated and closed with skin staples.  The wound was dressed with xeroform, 4 x4's, 2 ABD sponges, a single layer of webril and a TED stocking.   The patient was then awakened, extubated, and taken to the recovery room in stable condition.  BLOOD LOSS:  300cc DRAINS: 1 hemovac, 1 pain catheter COMPLICATIONS:  None.  PLAN OF CARE: Admit to inpatient   PATIENT DISPOSITION:  PACU - hemodynamically stable.   Delay start of Pharmacological VTE agent (>24hrs) due to surgical blood loss or risk of bleeding:  no

## 2011-03-29 NOTE — Transfer of Care (Signed)
Immediate Anesthesia Transfer of Care Note  Patient: Cody Blake  Procedure(s) Performed:  TOTAL KNEE ARTHROPLASTY - ARTHROPLASTY KNEE TOTAL LEFT  Patient Location: PACU  Anesthesia Type: General  Level of Consciousness: awake, alert  and oriented  Airway & Oxygen Therapy: Patient Spontanous Breathing and Patient connected to nasal cannula oxygen  Post-op Assessment: Report given to PACU RN and Post -op Vital signs reviewed and stable  Post vital signs: Reviewed and stable  Complications: No apparent anesthesia complications

## 2011-03-29 NOTE — Anesthesia Procedure Notes (Addendum)
Anesthesia Regional Block:  Femoral nerve block  Pre-Anesthetic Checklist: ,, timeout performed, Correct Patient, Correct Site, Correct Laterality, Correct Procedure, Correct Position, site marked, Risks and benefits discussed,  Surgical consent,  Pre-op evaluation,  At surgeon's request and post-op pain management  Laterality: Left  Prep: chloraprep       Needles:  Injection technique: Single-shot  Needle Type: Stimulator Needle - 80          Additional Needles:  Procedures: nerve stimulator Femoral nerve block Narrative:  Start time: 03/29/2011 8:15 AM End time: 03/29/2011 8:20 AM Injection made incrementally with aspirations every 5 mL.  Performed by: Personally   Additional Notes: 20cc 0.5% Marcaine w/o difficulty  Pt tolerated well.   Procedure Name: Intubation Date/Time: 03/29/2011 9:00 AM Performed by: Elizbeth Squires Pre-anesthesia Checklist: Patient identified, Emergency Drugs available, Suction available and Patient being monitored Patient Re-evaluated:Patient Re-evaluated prior to inductionOxygen Delivery Method: Circle System Utilized Preoxygenation: Pre-oxygenation with 100% oxygen Intubation Type: IV induction Ventilation: Mask ventilation without difficulty Laryngoscope Size: Mac and 3 Grade View: Grade I Tube type: Oral Tube size: 8.0 mm Number of attempts: 1 Airway Equipment and Method: stylet Placement Confirmation: ETT inserted through vocal cords under direct vision,  positive ETCO2,  breath sounds checked- equal and bilateral and CO2 detector Secured at: 22 cm Tube secured with: Tape Dental Injury: Teeth and Oropharynx as per pre-operative assessment

## 2011-03-29 NOTE — Consult Note (Signed)
  Date of Initial H&P 03/18/11  History reviewed, patient examined, no change in status, stable for surgery. Original H&P on paper in chart within last 30 days.  Dorthula Matas, [place] [phone]

## 2011-03-29 NOTE — Anesthesia Preprocedure Evaluation (Addendum)
Anesthesia Evaluation  Patient identified by MRN, date of birth, ID band Patient awake    Reviewed: Allergy & Precautions, H&P , NPO status , Patient's Chart, lab work & pertinent test results, reviewed documented beta blocker date and time   Airway Mallampati: II TM Distance: <3 FB Neck ROM: Full    Dental  (+) Partial Upper, Poor Dentition and Dental Advisory Given,    Pulmonary neg pulmonary ROS, Current Smoker,  1 1/2 - 2 packs a day   Pulmonary exam normal       Cardiovascular hypertension, regular Normal    Neuro/Psych RSD right leg - since 1996. Negative Neurological ROS  Negative Psych ROS   GI/Hepatic negative GI ROS, Neg liver ROS,   Endo/Other  Negative Endocrine ROS  Renal/GU negative Renal ROS  Genitourinary negative   Musculoskeletal   Abdominal   Peds  Hematology negative hematology ROS (+)   Anesthesia Other Findings   Reproductive/Obstetrics                       Anesthesia Physical Anesthesia Plan  ASA: II  Anesthesia Plan: General   Post-op Pain Management: MAC Combined w/ Regional for Post-op pain   Induction: Intravenous  Airway Management Planned: Oral ETT  Additional Equipment:   Intra-op Plan:   Post-operative Plan: Extubation in OR  Informed Consent: I have reviewed the patients History and Physical, chart, labs and discussed the procedure including the risks, benefits and alternatives for the proposed anesthesia with the patient or authorized representative who has indicated his/her understanding and acceptance.   Dental Advisory Given  Plan Discussed with: CRNA, Anesthesiologist and Surgeon  Anesthesia Plan Comments:        Anesthesia Quick Evaluation

## 2011-03-29 NOTE — Anesthesia Postprocedure Evaluation (Signed)
  Anesthesia Post-op Note  Patient: Cody Blake  Procedure(s) Performed:  TOTAL KNEE ARTHROPLASTY - ARTHROPLASTY KNEE TOTAL LEFT  Patient Location: PACU  Anesthesia Type: GA combined with regional for post-op pain  Level of Consciousness: awake, alert , oriented and patient cooperative  Airway and Oxygen Therapy: Patient Spontanous Breathing and Patient connected to nasal cannula oxygen  Post-op Pain: mild  Post-op Assessment: Post-op Vital signs reviewed, Patient's Cardiovascular Status Stable, Respiratory Function Stable, Patent Airway, No signs of Nausea or vomiting and Pain level controlled  Post-op Vital Signs: stable  Complications: No apparent anesthesia complications

## 2011-03-30 LAB — BASIC METABOLIC PANEL
BUN: 10 mg/dL (ref 6–23)
Calcium: 8.5 mg/dL (ref 8.4–10.5)
GFR calc non Af Amer: >90 mL/min (ref >90)
Glucose, Bld: 123 mg/dL — ABNORMAL HIGH (ref 70–99)
Sodium: 138 mEq/L (ref 135–145)

## 2011-03-30 LAB — CBC
MCH: 32.8 pg (ref 26.0–34.0)
MCHC: 32.9 g/dL (ref 30.0–36.0)
Platelets: 178 10*3/uL (ref 150–400)
RBC: 3.57 MIL/uL — ABNORMAL LOW (ref 4.22–5.81)
RDW: 13.1 % (ref 11.5–15.5)
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
## 2011-03-30 NOTE — Progress Notes (Signed)
PATIENT ID:      Cody Blake  MRN:     [id number] DOB/AGE:    48-Sep-1962 / 48 y.o.    PROGRESS NOTE Subjective:  negative for Chest Pain  negative for Shortness of Breath  negative for Nausea/Vomiting   negative for Calf Pain  negative for Bowel Movement   Tolerating Diet: yes         Patient reports pain as 6 on 0-10 scale.    Objective: Vital signs in last 24 hours:  Patient Vitals for the past 24 hrs:  BP Temp Pulse Resp SpO2  03/30/11 0544 118/53 mmHg 98.7 F (37.1 C) 91  20  91 %  03/30/11 0100 110/62 mmHg 99 F (37.2 C) 72  18  94 %  03/29/11 2114 112/61 mmHg 99.2 F (37.3 C) 97  18  91 %  03/29/11 1400 - - 71  15  92 %  03/29/11 1353 115/70 mmHg - - - -  03/29/11 1345 - - 64  12  96 %  03/29/11 1337 109/63 mmHg - - - -  03/29/11 1330 - - 66  15  97 %  03/29/11 1323 104/63 mmHg - - - -  03/29/11 1322 - 98.3 F (36.8 C) - - -  03/29/11 1315 - - 66  15  94 %  03/29/11 1308 105/69 mmHg - - - -  03/29/11 1300 - - 64  13  97 %  03/29/11 1253 112/67 mmHg - - - -  03/29/11 1245 - - 62  16  97 %  03/29/11 1238 115/62 mmHg - - - -  03/29/11 1230 - - 69  12  98 %  03/29/11 1223 110/58 mmHg - - - -  03/29/11 1215 - - 69  21  95 %  03/29/11 1207 111/57 mmHg - - - -  03/29/11 1200 - - 69  18  94 %  03/29/11 1153 104/61 mmHg - - - -  03/29/11 1146 - - - - 0 %  03/29/11 1145 - - 66  23  94 %  03/29/11 1140 114/65 mmHg 98.7 F (37.1 C) 67  19  96 %      Intake/Output from previous day:   11/12 0701 - 11/13 0700 In: 4150 [P.O.:1200; I.V.:2950] Out: 3600 [Urine:1800; Drains:1550]   Intake/Output this shift:       Intake/Output      11/12 0701 - 11/13 0700 11/13 0701 - 11/14 0700   P.O. 1200    I.V. (mL/kg) 2950 (41.7)    Total Intake(mL/kg) 4150 (58.6)    Urine (mL/kg/hr) 1800 (1.1)    Drains 1550    Blood 250    Total Output 3600    Net +550            LABORATORY DATA:  Basename 03/30/11 0515  WBC 13.6*  HGB 11.7*  HCT 35.6*  PLT 178    Basename  03/30/11 0515  NA 138  K 3.7  CL 105  CO2 23  BUN 10  CREATININE 0.80  GLUCOSE 123*  CALCIUM 8.5   Lab Results  Component Value Date   INR 1.00 03/18/2011   INR 1.16 07/24/2009   INR 1.07 07/21/2009    Examination:  General appearance: alert and cooperative Extremities: extremities normal, atraumatic, no cyanosis or edema and Homans sign is negative, no sign of DVT  Wound Exam: clean, dry, intact   Drainage:  None: wound tissue dry  Motor Exam EHL and FHL Intact  Sensory Exam Superficial Peroneal, Deep Peroneal and Tibial normal  Assessment:    1 Day Post-Op  Procedure(s) (LRB): TOTAL KNEE ARTHROPLASTY (Left)  ADDITIONAL DIAGNOSIS:  Active Problems:  * No active hospital problems. *   Acute Blood Loss Anemia   Plan: Physical Therapy as ordered Weight Bearing as Tolerated (WBAT)  DVT Prophylaxis:  Lovenox  DISCHARGE PLAN: Home  DISCHARGE NEEDS: HHPT, CPM, Walker and 3-in-1 comode seat         Trevor Wilkie 03/30/2011, 8:45 AM
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
## 2011-03-30 NOTE — Progress Notes (Signed)
Physical Therapy Treatment Patient Details Name: Cody Blake MRN: [id number] DOB: 09-01-1960 Today's Date: 03/30/2011  PT Assessment/Plan  PT - Assessment/Plan Comments on Treatment Session: too tired to get back up for mobility this pm but completed exercises PT Plan: Discharge plan remains appropriate PT Frequency: 7X/week Follow Up Recommendations: Home health PT Equipment Recommended: None recommended by PT (already in home) PT Goals  Acute Rehab PT Goals PT Goal Formulation: With patient Time For Goal Achievement: 7 days Pt will go Supine/Side to Sit: with HOB not 0 degrees (comment degree);Independently PT Goal: Supine/Side to Sit - Progress: Progressing toward goal Pt will go Sit to Supine/Side: Independently PT Goal: Sit to Supine/Side - Progress: Not met Pt will Transfer Sit to Stand/Stand to Sit: with modified independence PT Transfer Goal: Sit to Stand/Stand to Sit - Progress: Progressing toward goal Pt will Ambulate: 51 - 150 feet;with rolling walker PT Goal: Ambulate - Progress: Progressing toward goal Pt will Go Up / Down Stairs: 1-2 stairs;with min assist;with rolling walker PT Goal: Up/Down Stairs - Progress: Not met Pt will Perform Home Exercise Program: with min assist PT Goal: Perform Home Exercise Program - Progress: Progressing toward goal  PT Treatment Precautions/Restrictions  Restrictions Weight Bearing Restrictions: Yes LLE Weight Bearing: Weight bearing as tolerated Mobility (including Balance) Bed Mobility Bed Mobility: No (pt. just back to bed with nursing) Transfers Transfers: No (pt. back in bed) Ambulation/Gait Ambulation/Gait: No (pt. in bed)    Exercise  Total Joint Exercises General Exercises - Lower Extremity Ankle Circles/Pumps: AROM;Both;10 reps Quad Sets: AROM;Left;Other reps (comment) (tolerated 7 reps) Short Arc Quad: AAROM;Left;10 reps (focus on eccentric contraction) End of Session Activity Tolerance: Patient limited by  pain;Patient limited by fatigue Patient left: in bed General Behavior During Session: [facility] for tasks performed Cognition: [facility] for tasks performed  Ferman Hamming 03/30/2011, 2:32 PM Acute Rehabilitation Services [phone] (pager)

## 2011-03-31 LAB — BASIC METABOLIC PANEL
Calcium: 8.7 mg/dL (ref 8.4–10.5)
GFR calc Af Amer: >90 mL/min (ref >90)
GFR calc non Af Amer: >90 mL/min (ref >90)
Sodium: 136 mEq/L (ref 135–145)

## 2011-03-31 LAB — CBC
Platelets: 149 10*3/uL — ABNORMAL LOW (ref 150–400)
RBC: 3.09 MIL/uL — ABNORMAL LOW (ref 4.22–5.81)
WBC: 14.3 10*3/uL — ABNORMAL HIGH (ref 4.0–10.5)

## 2011-03-31 MED ORDER — METHOCARBAMOL 500 MG PO TABS: 500.0000 mg | ORAL_TABLET | Freq: Four times a day (QID) | ORAL | Status: AC | PRN

## 2011-03-31 MED ORDER — OXYCODONE HCL 20 MG PO TB12: 20.0000 mg | ORAL_TABLET | Freq: Two times a day (BID) | ORAL | Status: AC

## 2011-03-31 MED ORDER — ENOXAPARIN SODIUM 30 MG/0.3ML ~~LOC~~ SOLN: 40.0000 mg | SUBCUTANEOUS | Status: DC

## 2011-03-31 MED ORDER — OXYCODONE HCL 5 MG PO TABS: 5.0000 mg | ORAL_TABLET | ORAL | Status: AC | PRN

## 2011-03-31 NOTE — Progress Notes (Signed)
Spoke with patient. Preoperatively setup with [facility]. No changes. Rolling walker, 3in1 and CPM have been delivered to the house.

## 2011-03-31 NOTE — Progress Notes (Signed)
PATIENT ID:      Cody Blake  MRN:     [id number] DOB/AGE:    12/30/1960 / 50 y.o.    PROGRESS NOTE Subjective:  negative for Chest Pain  negative for Shortness of Breath  negative for Nausea/Vomiting   negative for Calf Pain  negative for Bowel Movement   Tolerating Diet: yes         Patient reports pain as 3 on 0-10 scale.    Objective: Vital signs in last 24 hours:  Patient Vitals for the past 24 hrs:  BP Temp Temp src Pulse Resp SpO2  03/31/11 0552 102/68 mmHg 99.3 F (37.4 C) Oral 95  18  92 %  03/30/11 2145 114/58 mmHg 98.2 F (36.8 C) - 100  20  93 %  03/30/11 1600 124/59 mmHg 98.6 F (37 C) - 90  20  98 %      Intake/Output from previous day:   11/13 0701 - 11/14 0700 In: 3045 [P.O.:1440; I.V.:1605] Out: 2850 [Urine:1350; Drains:1500]   Intake/Output this shift:       Intake/Output      11/13 0701 - 11/14 0700 11/14 0701 - 11/15 0700   P.O. 1440    I.V. (mL/kg) 1605 (22.7)    Total Intake(mL/kg) 3045 (43)    Urine (mL/kg/hr) 1350 (0.8)    Drains 1500    Blood     Total Output 2850    Net +195         Urine Occurrence 2 x    Stool Occurrence 1 x       LABORATORY DATA:  Basename 03/31/11 0627 03/30/11 0515  WBC 14.3* 13.6*  HGB 10.1* 11.7*  HCT 30.5* 35.6*  PLT 149* 178    Basename 03/31/11 0627 03/30/11 0515  NA 136 138  K 4.2 3.7  CL 106 105  CO2 24 23  BUN 6 10  CREATININE 0.71 0.80  GLUCOSE 125* 123*  CALCIUM 8.7 8.5   Lab Results  Component Value Date   INR 1.00 03/18/2011   INR 1.16 07/24/2009   INR 1.07 07/21/2009    Examination:  General appearance: alert and cooperative Extremities: extremities normal, atraumatic, no cyanosis or edema and Homans sign is negative, no sign of DVT  Wound Exam: clean, dry, intact   Drainage:  None: wound tissue dry  Motor Exam EHL and FHL Intact  Sensory Exam Superficial Peroneal, Deep Peroneal and Tibial normal  Assessment:    2 Days Post-Op  Procedure(s) (LRB): TOTAL KNEE ARTHROPLASTY  (Left)  ADDITIONAL DIAGNOSIS:  Active Problems:  * No active hospital problems. *   Acute Blood Loss Anemia   Plan: Physical Therapy as ordered Weight Bearing as Tolerated (WBAT)  DVT Prophylaxis:  Lovenox  DISCHARGE PLAN: Home  DISCHARGE NEEDS: HHPT, CPM, Walker and 3-in-1 comode seat         Dewel Lotter 03/31/2011, 8:22 AM

## 2011-03-31 NOTE — Progress Notes (Signed)
Physical Therapy Treatment Patient Details Name: Cody Blake MRN: [id number] DOB: January 03, 1961 Today's Date: 03/31/2011  PT Assessment/Plan  PT - Assessment/Plan Comments on Treatment Session: Did stairs well. Wife present throughout PT Plan: Discharge plan remains appropriate PT Frequency: 7X/week Follow Up Recommendations: Home health PT Equipment Recommended: None recommended by PT PT Goals  Acute Rehab PT Goals PT Transfer Goal: Sit to Stand/Stand to Sit - Progress: Met PT Goal: Ambulate - Progress: Progressing toward goal PT Goal: Up/Down Stairs - Progress: Met  PT Treatment Precautions/Restrictions  Precautions Precautions: Knee Restrictions Weight Bearing Restrictions: Yes LLE Weight Bearing: Weight bearing as tolerated Mobility (including Balance) Transfers Transfers: Yes Sit to Stand: 6: Modified independent (Device/Increase time);From chair/3-in-1 Stand to Sit: 6: Modified independent (Device/Increase time);To chair/3-in-1 Ambulation/Gait Ambulation/Gait: Yes Ambulation/Gait Assistance: 5: Supervision Ambulation/Gait Assistance Details (indicate cue type and reason): Cues to heel strike on LLE Ambulation Distance (Feet): 30 Feet Assistive device: Rolling walker Gait Pattern: Step-to pattern;Decreased step length - left;Decreased step length - right;Trunk flexed Stairs: Yes Stairs Assistance: 4: Min assist Stairs Assistance Details (indicate cue type and reason): Cues for sequency and technique Stair Management Technique: Backwards;With walker Number of Stairs: 1  Wheelchair Mobility Wheelchair Mobility: No    Exercise    End of Session PT - End of Session Equipment Utilized During Treatment: Gait belt Activity Tolerance: Patient tolerated treatment well Patient left: in chair;with call bell in reach  Fredrich Birks 03/31/2011, 2:33 PM 03/31/2011 Fredrich Birks PTA [phone] pager [phone] office

## 2011-03-31 NOTE — Discharge Summary (Signed)
PATIENT ID:      Cody Blake  MRN:     [id number] DOB/AGE:    1961/04/03 / 50 y.o.     DISCHARGE SUMMARY  ADMISSION DATE:    03/29/2011 DISCHARGE DATE:   03/31/2011   ADMISSION DIAGNOSIS: OSTEOARTHRITIS LEFT KNEE  DISCHARGE DIAGNOSIS:  OSTEOARTHRITIS LEFT KNEE    ADDITIONAL DIAGNOSIS: Active Problems:  * No active hospital problems. *   Past Medical History  Diagnosis Date  . Arthritis   . Anxiety     PROCEDURE: Procedure(s): TOTAL KNEE ARTHROPLASTY on 03/29/2011  CONSULTS:     HISTORY:  See H&P in chart  HOSPITAL COURSE:  Cody Blake is a 50 y.o. admitted on 03/29/2011 and found to have a diagnosis of OSTEOARTHRITIS LEFT KNEE.  After appropriate laboratory studies were obtained  they were taken to the operating room on 03/29/2011 and underwent Procedure(s): TOTAL KNEE ARTHROPLASTY.   They were given perioperative antibiotics:  Anti-infectives     Start     Dose/Rate Route Frequency Ordered Stop   03/29/11 1200   ceFAZolin (ANCEF) IVPB 1 g/50 mL premix        1 g 100 mL/hr over 30 Minutes Intravenous Every 6 hours 03/29/11 1145 04-12-2011 0559   03/28/11 1315   ceFAZolin (ANCEF) IVPB 1 g/50 mL premix        1 g 100 mL/hr over 30 Minutes Intravenous 60 min pre-op 03/28/11 1304 03/29/11 0902        .   The remainder of the hospital course was dedicated to ambulation and strengthening.   The patient was discharged on 2 Days Post-Op in stable condition.   DIAGNOSTIC STUDIES: Recent vital signs: Temp:  [98.2 F (36.8 C)-99.3 F (37.4 C)] 99.3 F (37.4 C) (11/14 0552) Pulse Rate:  [90-100] 95  (11/14 0552) Resp:  [18-20] 18  (11/14 0552) BP: (102-124)/(58-68) 102/68 mmHg (11/14 0552) SpO2:  [92 %-98 %] 92 % (11/14 0552)    Recent laboratory studies:  [facility] 03/31/11 0627 04/12/11 0515  WBC 14.3* 13.6*  HGB 10.1* 11.7*  HCT 30.5* 35.6*  PLT 149* 178    Basename 03/31/11 0627 April 12, 2011 0515  NA 136 138  K 4.2 3.7  CL 106 105  CO2 24 23  BUN 6 10    CREATININE 0.71 0.80  GLUCOSE 125* 123*  CALCIUM 8.7 8.5   Lab Results  Component Value Date   INR 1.00 03/18/2011   INR 1.16 07/24/2009   INR 1.07 07/21/2009     Recent Radiographic Studies :  Dg Chest 2 View  03/18/2011  *RADIOLOGY REPORT*  Clinical Data: Preop left total knee.  CHEST - 2 VIEW  Comparison: 07/30/2009 next few  Findings: Heart size is normal.  There is no pleural effusion or pulmonary edema.  There is no airspace consolidation identified.  Coarsened interstitial markings are noted bilaterally.  Scarring is noted within the right middle lobe.  IMPRESSION: 1.  Right middle lobe scarring. 2.  Chronic interstitial coarsening suggestive of COPD.  Original Report Authenticated By: Rosealee Albee, M.D.   Dg Cervical Spine Complete  03/04/2011  *RADIOLOGY REPORT*  Clinical Data: Fall, syncope, posterior neck pain.  CERVICAL SPINE - COMPLETE 4+ VIEW  Comparison: 07/21/2009  Findings: Mild scoliosis in the cervical spine.  No subluxation. No fracture.  Prevertebral soft tissues are normal.  Disc spaces are maintained.  IMPRESSION: No acute bony abnormality.  Original Report Authenticated By: Cyndie Chime, M.D.   Dg Thoracic Spine 2 View  03/04/2011  *RADIOLOGY REPORT*  Clinical Data: Fall, syncope, back pain.  THORACIC SPINE - 2 VIEW  Comparison: Chest CT 07/21/2009  Findings: Normal alignment.  No fracture.  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  IMPRESSION: No acute bony abnormality.  Original Report Authenticated By: Cyndie Chime, M.D.   Dg Shoulder Right  03/04/2011  *RADIOLOGY REPORT*  Clinical Data: Fall, right shoulder pain.  RIGHT SHOULDER - 2+ VIEW  Comparison: None.  Findings: No acute bony abnormality.  Specifically, no fracture, subluxation, or dislocation.  Soft tissues are intact.  Early degenerative changes in the right shoulder.  Chronic changes of scarring in the right upper lobe.  IMPRESSION: No acute bony abnormality.  Original Report  Authenticated By: Cyndie Chime, M.D.    DISCHARGE INSTRUCTIONS:   DISCHARGE MEDICATIONS:   Current Discharge Medication List    START taking these medications   Details  enoxaparin (LOVENOX) 30 MG/0.3ML SOLN Inject 0.4 mLs (40 mg total) into the skin daily. Qty: 10 Syringe, Refills: 0    methocarbamol (ROBAXIN) 500 MG tablet Take 1 tablet (500 mg total) by mouth every 6 (six) hours as needed. Qty: 60 tablet, Refills: 0    oxyCODONE (OXY IR/ROXICODONE) 5 MG immediate release tablet Take 1-2 tablets (5-10 mg total) by mouth every 4 (four) hours as needed. Qty: 60 tablet, Refills: 0      CONTINUE these medications which have CHANGED   Details  oxyCODONE (OXYCONTIN) 20 MG 12 hr tablet Take 1 tablet (20 mg total) by mouth every 12 (twelve) hours. Qty: 60 tablet, Refills: 0      CONTINUE these medications which have NOT CHANGED   Details  ALPRAZolam (XANAX) 1 MG tablet Take 1 mg by mouth 3 (three) times daily.      carisoprodol (SOMA) 350 MG tablet Take 350 mg by mouth 4 (four) times daily.      rOPINIRole (REQUIP) 4 MG tablet Take 4 mg by mouth at bedtime.      topiramate (TOPAMAX) 100 MG tablet Take 100 mg by mouth 3 (three) times daily.      VITAMIN A PO Take 1 capsule by mouth daily.      vitamin C (ASCORBIC ACID) 500 MG tablet Take 500 mg by mouth daily.      zinc sulfate 220 MG capsule Take 220 mg by mouth daily.      zolpidem (AMBIEN) 10 MG tablet Take 10 mg by mouth at bedtime.          FOLLOW UP VISIT:   Follow-up Information    Follow up with Raymon Mutton, MD. Call on 04/13/2011.   Contact information:   [address] [place] [address] [place] [address] [phone]          DISPOSITION:   Home or Self Care    Cody Blake 03/31/2011, 12:20 PM

## 2011-03-31 NOTE — Progress Notes (Signed)
Pt and wife verbalizes understanding of discharge instructions,prescriptions given to pt and wife verbalizes understanding. Pt iv dc'ed. Pt ambuilated to w/c pt has all belongings. Pt dc'ed in w/c with guest services.

## 2011-03-31 NOTE — Progress Notes (Signed)
Physical Therapy Treatment Patient Details Name: Cody Blake MRN: [id number] DOB: Jan 24, 1961 Today's Date: 03/31/2011  PT Assessment/Plan  PT - Assessment/Plan Comments on Treatment Session: Pt ambulating further this sessin. Pt eager to DC home this afternoon. Will need to review one step prior to DC home.  PT Plan: Discharge plan remains appropriate PT Frequency: 7X/week Follow Up Recommendations: Home health PT Equipment Recommended: None recommended by PT PT Goals  Acute Rehab PT Goals PT Goal: Supine/Side to Sit - Progress: Progressing toward goal PT Transfer Goal: Sit to Stand/Stand to Sit - Progress: Progressing toward goal PT Goal: Ambulate - Progress: Progressing toward goal PT Goal: Perform Home Exercise Program - Progress: Progressing toward goal  PT Treatment Precautions/Restrictions  Precautions Precautions: Knee Restrictions Weight Bearing Restrictions: Yes LLE Weight Bearing: Weight bearing as tolerated Mobility (including Balance) Bed Mobility Supine to Sit: 6: Modified independent (Device/Increase time) Transfers Transfers: Yes Sit to Stand: Other (comment) (Minguard A) Sit to Stand Details (indicate cue type and reason): Cues for safest hand placement Stand to Sit: Other (comment) (MinGuard A) Stand to Sit Details: Cues to slide out LLE Ambulation/Gait Ambulation/Gait: Yes Ambulation/Gait Assistance: Other (comment) (MinGuard A) Ambulation/Gait Assistance Details (indicate cue type and reason): Cues for gait sequence and RW positioning.  Ambulation Distance (Feet): 75 Feet Assistive device: Rolling walker Gait Pattern: Trunk flexed;Decreased step length - right;Step-to pattern Stairs: No    Exercise  Total Joint Exercises Quad Sets: AROM;Strengthening;Left;10 reps;Seated Heel Slides: AAROM;Strengthening;Left;10 reps;Seated Hip ABduction/ADduction: AAROM;Strengthening;Left;15 reps;Seated Straight Leg Raises: AAROM;Strengthening;Left;10  reps;Seated End of Session PT - End of Session Equipment Utilized During Treatment: Gait belt Activity Tolerance: Patient tolerated treatment well Patient left: in chair;with call bell in reach;with family/visitor present General Behavior During Session: [facility] for tasks performed Cognition: [facility] for tasks performed  Fredrich Birks 03/31/2011, 10:25 AM 03/31/2011 Fredrich Birks PTA [phone] pager [phone] office

## 2011-04-01 ENCOUNTER — Encounter (HOSPITAL_COMMUNITY): Payer: Self-pay | Admitting: Orthopedic Surgery

## 2011-04-20 ENCOUNTER — Ambulatory Visit: Payer: 59 | Attending: Orthopedic Surgery | Admitting: Rehabilitation

## 2011-04-20 DIAGNOSIS — R269 Unspecified abnormalities of gait and mobility: Secondary | ICD-10-CM | POA: Insufficient documentation

## 2011-04-20 DIAGNOSIS — M25569 Pain in unspecified knee: Secondary | ICD-10-CM | POA: Insufficient documentation

## 2011-04-20 DIAGNOSIS — IMO0001 Reserved for inherently not codable concepts without codable children: Secondary | ICD-10-CM | POA: Insufficient documentation

## 2011-04-20 DIAGNOSIS — M25669 Stiffness of unspecified knee, not elsewhere classified: Secondary | ICD-10-CM | POA: Insufficient documentation

## 2011-04-26 ENCOUNTER — Ambulatory Visit: Payer: 59 | Admitting: Rehabilitation

## 2011-04-28 ENCOUNTER — Ambulatory Visit: Payer: 59 | Admitting: Physical Therapy

## 2011-04-29 ENCOUNTER — Encounter: Payer: 59 | Admitting: Rehabilitation

## 2011-04-30 ENCOUNTER — Ambulatory Visit: Payer: 59 | Admitting: Rehabilitation

## 2011-05-03 ENCOUNTER — Ambulatory Visit: Payer: 59 | Admitting: Physical Therapy

## 2011-05-05 ENCOUNTER — Ambulatory Visit: Payer: 59 | Admitting: Physical Therapy

## 2011-05-07 ENCOUNTER — Ambulatory Visit: Payer: 59 | Admitting: Rehabilitation

## 2011-05-12 ENCOUNTER — Ambulatory Visit: Payer: 59 | Admitting: Rehabilitation

## 2011-05-17 ENCOUNTER — Ambulatory Visit: Payer: 59 | Admitting: Physical Therapy

## 2011-05-19 ENCOUNTER — Ambulatory Visit: Payer: 59 | Attending: Orthopedic Surgery | Admitting: Physical Therapy

## 2011-05-19 DIAGNOSIS — M25569 Pain in unspecified knee: Secondary | ICD-10-CM | POA: Insufficient documentation

## 2011-05-19 DIAGNOSIS — IMO0001 Reserved for inherently not codable concepts without codable children: Secondary | ICD-10-CM | POA: Insufficient documentation

## 2011-05-19 DIAGNOSIS — R269 Unspecified abnormalities of gait and mobility: Secondary | ICD-10-CM | POA: Insufficient documentation

## 2011-05-19 DIAGNOSIS — M25669 Stiffness of unspecified knee, not elsewhere classified: Secondary | ICD-10-CM | POA: Insufficient documentation

## 2011-05-25 ENCOUNTER — Ambulatory Visit: Payer: 59 | Admitting: Rehabilitation

## 2011-05-26 ENCOUNTER — Ambulatory Visit: Payer: 59 | Admitting: Rehabilitation

## 2011-05-31 ENCOUNTER — Ambulatory Visit: Payer: 59 | Admitting: Physical Therapy

## 2011-06-02 ENCOUNTER — Ambulatory Visit: Payer: 59 | Admitting: Rehabilitation

## 2011-06-07 ENCOUNTER — Ambulatory Visit: Payer: 59 | Admitting: Physical Therapy

## 2011-06-09 ENCOUNTER — Encounter: Payer: 59 | Admitting: Rehabilitation

## 2011-06-16 ENCOUNTER — Ambulatory Visit: Payer: 59 | Admitting: Rehabilitation

## 2011-06-22 ENCOUNTER — Ambulatory Visit: Payer: 59 | Attending: Orthopedic Surgery | Admitting: Rehabilitation

## 2011-06-22 DIAGNOSIS — IMO0001 Reserved for inherently not codable concepts without codable children: Secondary | ICD-10-CM | POA: Insufficient documentation

## 2011-06-22 DIAGNOSIS — R269 Unspecified abnormalities of gait and mobility: Secondary | ICD-10-CM | POA: Insufficient documentation

## 2011-06-22 DIAGNOSIS — M25569 Pain in unspecified knee: Secondary | ICD-10-CM | POA: Insufficient documentation

## 2011-06-22 DIAGNOSIS — M25669 Stiffness of unspecified knee, not elsewhere classified: Secondary | ICD-10-CM | POA: Insufficient documentation

## 2011-06-24 ENCOUNTER — Encounter: Payer: 59 | Admitting: Rehabilitation

## 2011-06-29 ENCOUNTER — Ambulatory Visit: Payer: 59 | Admitting: Rehabilitation

## 2011-07-01 ENCOUNTER — Encounter: Payer: 59 | Admitting: Rehabilitation

## 2011-07-06 ENCOUNTER — Ambulatory Visit: Payer: 59 | Admitting: Rehabilitation

## 2011-07-08 ENCOUNTER — Ambulatory Visit: Payer: 59 | Admitting: Rehabilitation

## 2011-07-13 ENCOUNTER — Encounter: Payer: 59 | Admitting: Rehabilitation

## 2011-07-15 ENCOUNTER — Encounter: Payer: 59 | Admitting: Rehabilitation

## 2014-03-19 ENCOUNTER — Ambulatory Visit (INDEPENDENT_AMBULATORY_CARE_PROVIDER_SITE_OTHER): Payer: 59 | Admitting: Neurology

## 2014-03-19 ENCOUNTER — Encounter: Payer: Self-pay | Admitting: Neurology

## 2014-03-19 VITALS — BP 137/82 | HR 96 | Ht 70.0 in | Wt 164.2 lb

## 2014-03-19 DIAGNOSIS — R519 Headache, unspecified: Secondary | ICD-10-CM

## 2014-03-19 DIAGNOSIS — R51 Headache: Secondary | ICD-10-CM

## 2014-03-19 DIAGNOSIS — G44321 Chronic post-traumatic headache, intractable: Secondary | ICD-10-CM

## 2014-03-19 HISTORY — DX: Headache, unspecified: R51.9

## 2014-03-19 MED ORDER — PREDNISONE 10 MG PO TABS: ORAL_TABLET | ORAL | Status: DC

## 2014-03-19 MED ORDER — AMITRIPTYLINE HCL 25 MG PO TABS
ORAL_TABLET | ORAL | Status: DC
Start: 2014-03-19 — End: 2017-08-28

## 2014-03-19 NOTE — Patient Instructions (Signed)
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
## 2014-03-20 ENCOUNTER — Encounter (HOSPITAL_COMMUNITY): Payer: Self-pay | Admitting: Registered Nurse

## 2014-03-20 ENCOUNTER — Emergency Department (HOSPITAL_COMMUNITY): Payer: 59

## 2014-03-20 ENCOUNTER — Encounter (HOSPITAL_COMMUNITY): Payer: Self-pay | Admitting: Emergency Medicine

## 2014-03-20 ENCOUNTER — Encounter: Payer: Self-pay | Admitting: Neurology

## 2014-03-20 ENCOUNTER — Emergency Department (HOSPITAL_COMMUNITY)
Admission: EM | Admit: 2014-03-20 | Discharge: 2014-03-20 | Disposition: A | Discharge status: Home / Self Care | Payer: 59 | Attending: Emergency Medicine | Admitting: Emergency Medicine

## 2014-03-20 DIAGNOSIS — Z7951 Long term (current) use of inhaled steroids: Secondary | ICD-10-CM | POA: Insufficient documentation

## 2014-03-20 DIAGNOSIS — R4689 Other symptoms and signs involving appearance and behavior: Secondary | ICD-10-CM

## 2014-03-20 DIAGNOSIS — Z720 Tobacco use: Secondary | ICD-10-CM | POA: Insufficient documentation

## 2014-03-20 DIAGNOSIS — F22 Delusional disorders: Secondary | ICD-10-CM | POA: Diagnosis present

## 2014-03-20 DIAGNOSIS — Z7952 Long term (current) use of systemic steroids: Secondary | ICD-10-CM | POA: Diagnosis not present

## 2014-03-20 DIAGNOSIS — F29 Unspecified psychosis not due to a substance or known physiological condition: Secondary | ICD-10-CM

## 2014-03-20 DIAGNOSIS — R4182 Altered mental status, unspecified: Secondary | ICD-10-CM

## 2014-03-20 DIAGNOSIS — M1990 Unspecified osteoarthritis, unspecified site: Secondary | ICD-10-CM | POA: Diagnosis not present

## 2014-03-20 DIAGNOSIS — H9319 Tinnitus, unspecified ear: Secondary | ICD-10-CM | POA: Insufficient documentation

## 2014-03-20 DIAGNOSIS — F919 Conduct disorder, unspecified: Secondary | ICD-10-CM | POA: Diagnosis not present

## 2014-03-20 DIAGNOSIS — Z79899 Other long term (current) drug therapy: Secondary | ICD-10-CM | POA: Diagnosis not present

## 2014-03-20 LAB — COMPREHENSIVE METABOLIC PANEL
ALK PHOS: 103 U/L (ref 39–117)
ALT: 28 U/L (ref 0–53)
AST: 33 U/L (ref 0–37)
Albumin: 4.4 g/dL (ref 3.5–5.2)
Anion gap: 22 — ABNORMAL HIGH (ref 5–15)
BILIRUBIN TOTAL: 0.4 mg/dL (ref 0.3–1.2)
BUN: 12 mg/dL (ref 6–23)
CHLORIDE: 106 meq/L (ref 96–112)
CO2: 17 meq/L — AB (ref 19–32)
Calcium: 9.8 mg/dL (ref 8.4–10.5)
Creatinine, Ser: 0.79 mg/dL (ref 0.50–1.35)
GFR calc non Af Amer: >90 mL/min (ref >90)
GLUCOSE: 120 mg/dL — AB (ref 70–99)
POTASSIUM: 4.2 meq/L (ref 3.7–5.3)
SODIUM: 145 meq/L (ref 137–147)
TOTAL PROTEIN: 8 g/dL (ref 6.0–8.3)

## 2014-03-20 LAB — BLOOD GAS, VENOUS
Acid-base deficit: 2.5 mmol/L — ABNORMAL HIGH (ref 0.0–2.0)
Bicarbonate: 21.7 mEq/L (ref 20.0–24.0)
O2 Saturation: 55 %
PATIENT TEMPERATURE: 98.6
TCO2: 19.3 mmol/L (ref 0–100)
pCO2, Ven: 37.6 mmHg — ABNORMAL LOW (ref 45.0–50.0)
pH, Ven: 7.378 — ABNORMAL HIGH (ref 7.250–7.300)

## 2014-03-20 LAB — CBC
HEMATOCRIT: 43.9 % (ref 39.0–52.0)
HEMOGLOBIN: 15 g/dL (ref 13.0–17.0)
MCH: 32.5 pg (ref 26.0–34.0)
MCHC: 34.2 g/dL (ref 30.0–36.0)
MCV: 95 fL (ref 78.0–100.0)
Platelets: 255 10*3/uL (ref 150–400)
RBC: 4.62 MIL/uL (ref 4.22–5.81)
RDW: 13.2 % (ref 11.5–15.5)
WBC: 8.9 10*3/uL (ref 4.0–10.5)

## 2014-03-20 LAB — RAPID URINE DRUG SCREEN, HOSP PERFORMED
Amphetamines: NOT DETECTED
Barbiturates: NOT DETECTED
Benzodiazepines: POSITIVE — AB
Cocaine: NOT DETECTED
Opiates: POSITIVE — AB
Tetrahydrocannabinol: NOT DETECTED

## 2014-03-20 LAB — BASIC METABOLIC PANEL
Anion gap: 15 (ref 5–15)
BUN: 12 mg/dL (ref 6–23)
CO2: 21 mEq/L (ref 19–32)
Calcium: 8.9 mg/dL (ref 8.4–10.5)
Chloride: 107 mEq/L (ref 96–112)
Creatinine, Ser: 0.78 mg/dL (ref 0.50–1.35)
GFR calc non Af Amer: >90 mL/min (ref >90)
Glucose, Bld: 139 mg/dL — ABNORMAL HIGH (ref 70–99)
Potassium: 3.6 mEq/L — ABNORMAL LOW (ref 3.7–5.3)
Sodium: 143 mEq/L (ref 137–147)

## 2014-03-20 LAB — SALICYLATE LEVEL: Salicylate Lvl: <2 mg/dL — ABNORMAL LOW (ref 2.8–20.0)

## 2014-03-20 LAB — ACETAMINOPHEN LEVEL

## 2014-03-20 LAB — ETHANOL: Alcohol, Ethyl (B): <11 mg/dL (ref 0–11)

## 2014-03-20 LAB — I-STAT CG4 LACTIC ACID, ED: LACTIC ACID, VENOUS: 1.11 mmol/L (ref 0.5–2.2)

## 2014-03-20 MED ORDER — LORATADINE 10 MG PO TABS
10.0000 mg | ORAL_TABLET | Freq: Every day | ORAL | Status: DC
  Administered 2014-03-20: 10 mg via ORAL
  Filled 2014-03-20: qty 1

## 2014-03-20 MED ORDER — VITAMIN C 500 MG PO TABS
500.0000 mg | ORAL_TABLET | Freq: Every day | ORAL | Status: DC
  Administered 2014-03-20: 500 mg via ORAL
  Filled 2014-03-20: qty 1

## 2014-03-20 MED ORDER — ALUM & MAG HYDROXIDE-SIMETH 200-200-20 MG/5ML PO SUSP: 30.0000 mL | ORAL | Status: DC | PRN

## 2014-03-20 MED ORDER — CARISOPRODOL 350 MG PO TABS
350.0000 mg | ORAL_TABLET | Freq: Four times a day (QID) | ORAL | Status: DC
  Administered 2014-03-20: 350 mg via ORAL
  Filled 2014-03-20: qty 1

## 2014-03-20 MED ORDER — OXYCODONE HCL 5 MG PO TABS
20.0000 mg | ORAL_TABLET | Freq: Four times a day (QID) | ORAL | Status: DC
  Administered 2014-03-20: 20 mg via ORAL
  Filled 2014-03-20: qty 4

## 2014-03-20 MED ORDER — SODIUM CHLORIDE 0.9 % IV BOLUS (SEPSIS)
1000.0000 mL | Freq: Once | INTRAVENOUS | Status: AC
  Administered 2014-03-20: 1000 mL via INTRAVENOUS

## 2014-03-20 MED ORDER — LORAZEPAM 1 MG PO TABS
1.0000 mg | ORAL_TABLET | Freq: Three times a day (TID) | ORAL | Status: DC | PRN
Start: 2014-03-20 — End: 2014-03-20

## 2014-03-20 MED ORDER — IBUPROFEN 200 MG PO TABS: 600.0000 mg | ORAL_TABLET | Freq: Three times a day (TID) | ORAL | Status: DC | PRN

## 2014-03-20 MED ORDER — PANTOPRAZOLE SODIUM 40 MG PO TBEC
40.0000 mg | DELAYED_RELEASE_TABLET | Freq: Every day | ORAL | Status: DC
  Administered 2014-03-20: 40 mg via ORAL
  Filled 2014-03-20: qty 1

## 2014-03-20 MED ORDER — ACETAMINOPHEN 325 MG PO TABS: 650.0000 mg | ORAL_TABLET | ORAL | Status: DC | PRN

## 2014-03-20 MED ORDER — ZINC SULFATE 220 (50 ZN) MG PO CAPS
220.0000 mg | ORAL_CAPSULE | Freq: Every day | ORAL | Status: DC
  Administered 2014-03-20: 220 mg via ORAL
  Filled 2014-03-20: qty 1

## 2014-03-20 MED ORDER — NICOTINE 21 MG/24HR TD PT24: 21.0000 mg | MEDICATED_PATCH | Freq: Every day | TRANSDERMAL | Status: DC

## 2014-03-20 MED ORDER — ZOLPIDEM TARTRATE 5 MG PO TABS: 5.0000 mg | ORAL_TABLET | Freq: Every evening | ORAL | Status: DC | PRN

## 2014-03-20 MED ORDER — AMITRIPTYLINE HCL 25 MG PO TABS: 25.0000 mg | ORAL_TABLET | Freq: Every day | ORAL | Status: DC

## 2014-03-20 MED ORDER — ALPRAZOLAM 0.5 MG PO TABS
1.0000 mg | ORAL_TABLET | Freq: Three times a day (TID) | ORAL | Status: DC
  Administered 2014-03-20: 1 mg via ORAL
  Filled 2014-03-20: qty 2

## 2014-03-20 MED ORDER — FLUTICASONE PROPIONATE 50 MCG/ACT NA SUSP
1.0000 | Freq: Every day | NASAL | Status: DC
  Administered 2014-03-20: 1 via NASAL
  Filled 2014-03-20: qty 16

## 2014-03-20 MED ORDER — TOPIRAMATE 25 MG PO TABS: 100.0000 mg | ORAL_TABLET | Freq: Three times a day (TID) | ORAL | Status: DC

## 2014-03-20 MED ORDER — ONDANSETRON HCL 4 MG PO TABS: 4.0000 mg | ORAL_TABLET | Freq: Three times a day (TID) | ORAL | Status: DC | PRN

## 2014-03-20 MED ORDER — PREDNISONE 20 MG PO TABS: 10.0000 mg | ORAL_TABLET | ORAL | Status: DC

## 2014-03-20 MED ORDER — OXYCODONE HCL ER 15 MG PO T12A: 15.0000 mg | EXTENDED_RELEASE_TABLET | Freq: Two times a day (BID) | ORAL | Status: DC

## 2014-03-20 MED ORDER — ROPINIROLE HCL 1 MG PO TABS
4.0000 mg | ORAL_TABLET | Freq: Every day | ORAL | Status: DC
  Filled 2014-03-20: qty 4
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
## 2014-03-20 NOTE — ED Notes (Signed)
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
## 2014-03-20 NOTE — BH Assessment (Signed)
Per Cody Blake, [facility]. The accepting provider is Dr. Hardie PulleyKhol. Nursing report # [phone]. Patient is under IVC and will present to [facility] via [employer].

## 2014-03-20 NOTE — ED Notes (Signed)
Patient wife arrived requesting a head CT. She states the patient fell several weeks ago and has been having worsening headaches since that time. She states the patient saw a neurologist today who was sending the patient for an outpatient CT scan. Patient wife is requesting to speak to psychiatry related to patient care.   EMERGENCY CONTACT: Catalina LungerDonna Mcintyre [phone] (home) [phone] (cell) Password "[email or web address]"

## 2014-03-20 NOTE — ED Notes (Signed)
Pt refusing to sign at discharge. States does not want to be d/c'd. Explained to patient he is IVC'd and what that means and that he is going to [facility] Pt states " [facility]'m not [facility]".
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
## 2014-03-20 NOTE — ED Notes (Signed)
Pt wife at bedside, wanded by security prior to entering room.
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
## 2014-03-20 NOTE — ED Notes (Signed)
[facility] to Dr. Hardie PulleyKhol, report called to Merlyn LotBetty Oritz RN [facility]. Sheriff called to transport.

## 2014-06-19 ENCOUNTER — Ambulatory Visit: Payer: 59 | Admitting: Adult Health

## 2015-07-15 ENCOUNTER — Inpatient Hospital Stay (HOSPITAL_COMMUNITY)
Admission: EM | Admit: 2015-07-15 | Discharge: 2015-07-18 | DRG: 011 | Disposition: A | Discharge status: Other Acute Inpatient Hospital | Payer: 59 | Attending: General Surgery | Admitting: General Surgery

## 2015-07-15 ENCOUNTER — Emergency Department (HOSPITAL_COMMUNITY): Payer: 59 | Admitting: Certified Registered Nurse Anesthetist

## 2015-07-15 ENCOUNTER — Inpatient Hospital Stay (HOSPITAL_COMMUNITY): Payer: 59

## 2015-07-15 ENCOUNTER — Emergency Department (HOSPITAL_COMMUNITY): Payer: 59

## 2015-07-15 ENCOUNTER — Encounter (HOSPITAL_COMMUNITY): Payer: Self-pay | Admitting: Emergency Medicine

## 2015-07-15 ENCOUNTER — Encounter (HOSPITAL_COMMUNITY)
Admission: EM | Disposition: A | Discharge status: Other Acute Inpatient Hospital | Payer: Self-pay | Source: Home / Self Care

## 2015-07-15 DIAGNOSIS — S0181XA Laceration without foreign body of other part of head, initial encounter: Secondary | ICD-10-CM | POA: Diagnosis not present

## 2015-07-15 DIAGNOSIS — S0183XA Puncture wound without foreign body of other part of head, initial encounter: Secondary | ICD-10-CM

## 2015-07-15 DIAGNOSIS — S92002A Unspecified fracture of left calcaneus, initial encounter for closed fracture: Secondary | ICD-10-CM | POA: Diagnosis present

## 2015-07-15 DIAGNOSIS — S91309A Unspecified open wound, unspecified foot, initial encounter: Secondary | ICD-10-CM | POA: Diagnosis not present

## 2015-07-15 DIAGNOSIS — S01411A Laceration without foreign body of right cheek and temporomandibular area, initial encounter: Secondary | ICD-10-CM | POA: Diagnosis not present

## 2015-07-15 DIAGNOSIS — W3400XA Accidental discharge from unspecified firearms or gun, initial encounter: Secondary | ICD-10-CM

## 2015-07-15 DIAGNOSIS — T1491XA Suicide attempt, initial encounter: Secondary | ICD-10-CM | POA: Diagnosis present

## 2015-07-15 DIAGNOSIS — J9600 Acute respiratory failure, unspecified whether with hypoxia or hypercapnia: Secondary | ICD-10-CM | POA: Diagnosis present

## 2015-07-15 DIAGNOSIS — S91339A Puncture wound without foreign body, unspecified foot, initial encounter: Secondary | ICD-10-CM

## 2015-07-15 DIAGNOSIS — S01511A Laceration without foreign body of lip, initial encounter: Secondary | ICD-10-CM | POA: Diagnosis not present

## 2015-07-15 DIAGNOSIS — D62 Acute posthemorrhagic anemia: Secondary | ICD-10-CM | POA: Diagnosis not present

## 2015-07-15 DIAGNOSIS — S92022B Displaced fracture of anterior process of left calcaneus, initial encounter for open fracture: Secondary | ICD-10-CM | POA: Diagnosis not present

## 2015-07-15 DIAGNOSIS — S02601B Fracture of unspecified part of body of right mandible, initial encounter for open fracture: Secondary | ICD-10-CM | POA: Diagnosis present

## 2015-07-15 DIAGNOSIS — T148XXA Other injury of unspecified body region, initial encounter: Secondary | ICD-10-CM

## 2015-07-15 DIAGNOSIS — S0240DB Maxillary fracture, left side, initial encounter for open fracture: Secondary | ICD-10-CM | POA: Diagnosis not present

## 2015-07-15 DIAGNOSIS — S0240CB Maxillary fracture, right side, initial encounter for open fracture: Secondary | ICD-10-CM | POA: Diagnosis present

## 2015-07-15 DIAGNOSIS — T1491 Suicide attempt: Secondary | ICD-10-CM | POA: Diagnosis not present

## 2015-07-15 DIAGNOSIS — S0292XA Unspecified fracture of facial bones, initial encounter for closed fracture: Secondary | ICD-10-CM | POA: Diagnosis present

## 2015-07-15 DIAGNOSIS — S0180XA Unspecified open wound of other part of head, initial encounter: Secondary | ICD-10-CM | POA: Diagnosis not present

## 2015-07-15 DIAGNOSIS — X72XXXA Intentional self-harm by handgun discharge, initial encounter: Secondary | ICD-10-CM | POA: Diagnosis not present

## 2015-07-15 DIAGNOSIS — Z4659 Encounter for fitting and adjustment of other gastrointestinal appliance and device: Secondary | ICD-10-CM

## 2015-07-15 DIAGNOSIS — S02600B Fracture of unspecified part of body of mandible, initial encounter for open fracture: Secondary | ICD-10-CM | POA: Diagnosis present

## 2015-07-15 DIAGNOSIS — T1490XA Injury, unspecified, initial encounter: Secondary | ICD-10-CM

## 2015-07-15 DIAGNOSIS — S0993XA Unspecified injury of face, initial encounter: Secondary | ICD-10-CM | POA: Diagnosis present

## 2015-07-15 HISTORY — PX: ORIF MANDIBULAR FRACTURE: SHX2127

## 2015-07-15 HISTORY — DX: Other psychoactive substance abuse, uncomplicated: F19.10

## 2015-07-15 LAB — I-STAT ARTERIAL BLOOD GAS, ED
ACID-BASE DEFICIT: 9 mmol/L — AB (ref 0.0–2.0)
ACID-BASE DEFICIT: 10 mmol/L — AB (ref 0.0–2.0)
BICARBONATE: 17.7 meq/L — AB (ref 20.0–24.0)
Bicarbonate: 18.9 mEq/L — ABNORMAL LOW (ref 20.0–24.0)
O2 SAT: 99 %
O2 SAT: 95 %
PH ART: 7.193 — AB (ref 7.350–7.450)
PO2 ART: 96 mmHg (ref 80.0–100.0)
TCO2: 20 mmol/L (ref 0–100)
TCO2: 19 mmol/L (ref 0–100)
pCO2 arterial: 49.1 mmHg — ABNORMAL HIGH (ref 35.0–45.0)
pCO2 arterial: 47 mmHg — ABNORMAL HIGH (ref 35.0–45.0)
pH, Arterial: 7.184 — CL (ref 7.350–7.450)
pO2, Arterial: 164 mmHg — ABNORMAL HIGH (ref 80.0–100.0)

## 2015-07-15 LAB — CBC
HCT: 32.7 % — ABNORMAL LOW (ref 39.0–52.0)
HEMATOCRIT: 47.5 % (ref 39.0–52.0)
HEMOGLOBIN: 15.8 g/dL (ref 13.0–17.0)
HEMOGLOBIN: 10.5 g/dL — AB (ref 13.0–17.0)
MCH: 34.4 pg — ABNORMAL HIGH (ref 26.0–34.0)
MCH: 32.9 pg (ref 26.0–34.0)
MCHC: 33.3 g/dL (ref 30.0–36.0)
MCHC: 32.1 g/dL (ref 30.0–36.0)
MCV: 103.5 fL — ABNORMAL HIGH (ref 78.0–100.0)
MCV: 102.5 fL — ABNORMAL HIGH (ref 78.0–100.0)
PLATELETS: 164 10*3/uL (ref 150–400)
Platelets: 227 10*3/uL (ref 150–400)
RBC: 4.59 MIL/uL (ref 4.22–5.81)
RBC: 3.19 MIL/uL — ABNORMAL LOW (ref 4.22–5.81)
RDW: 13.9 % (ref 11.5–15.5)
RDW: 13.9 % (ref 11.5–15.5)
WBC: 28.7 10*3/uL — AB (ref 4.0–10.5)
WBC: 20.4 10*3/uL — ABNORMAL HIGH (ref 4.0–10.5)

## 2015-07-15 LAB — BLOOD GAS, ARTERIAL
ACID-BASE DEFICIT: 3.3 mmol/L — AB (ref 0.0–2.0)
Bicarbonate: 21.6 mEq/L (ref 20.0–24.0)
DRAWN BY: 22766
FIO2: 40
MECHVT: 550 mL
O2 SAT: 97.7 %
PEEP: 5 cmH2O
PO2 ART: 107 mmHg — AB (ref 80.0–100.0)
Patient temperature: 98.6
RATE: 22 resp/min
TCO2: 22.9 mmol/L (ref 0–100)
pCO2 arterial: 41.4 mmHg (ref 35.0–45.0)
pH, Arterial: 7.337 — ABNORMAL LOW (ref 7.350–7.450)

## 2015-07-15 LAB — BASIC METABOLIC PANEL
Anion gap: 8 (ref 5–15)
BUN: 11 mg/dL (ref 6–20)
CALCIUM: 7.4 mg/dL — AB (ref 8.9–10.3)
CO2: 22 mmol/L (ref 22–32)
CREATININE: 0.7 mg/dL (ref 0.61–1.24)
Chloride: 117 mmol/L — ABNORMAL HIGH (ref 101–111)
Glucose, Bld: 135 mg/dL — ABNORMAL HIGH (ref 65–99)
Potassium: 3.8 mmol/L (ref 3.5–5.1)
SODIUM: 147 mmol/L — AB (ref 135–145)

## 2015-07-15 LAB — COMPREHENSIVE METABOLIC PANEL
ALBUMIN: 3.9 g/dL (ref 3.5–5.0)
ALT: 15 U/L — ABNORMAL LOW (ref 17–63)
ANION GAP: 12 (ref 5–15)
AST: 44 U/L — ABNORMAL HIGH (ref 15–41)
Alkaline Phosphatase: 98 U/L (ref 38–126)
BILIRUBIN TOTAL: 1.7 mg/dL — AB (ref 0.3–1.2)
BUN: 12 mg/dL (ref 6–20)
CHLORIDE: 111 mmol/L (ref 101–111)
CO2: 20 mmol/L — ABNORMAL LOW (ref 22–32)
Calcium: 8.8 mg/dL — ABNORMAL LOW (ref 8.9–10.3)
Creatinine, Ser: 0.98 mg/dL (ref 0.61–1.24)
GFR calc Af Amer: >60 mL/min (ref >60)
Glucose, Bld: 177 mg/dL — ABNORMAL HIGH (ref 65–99)
POTASSIUM: 5 mmol/L (ref 3.5–5.1)
Sodium: 143 mmol/L (ref 135–145)
TOTAL PROTEIN: 6.4 g/dL — AB (ref 6.5–8.1)

## 2015-07-15 LAB — PREPARE FRESH FROZEN PLASMA
UNIT DIVISION: 0
Unit division: 0

## 2015-07-15 LAB — TYPE AND SCREEN
ABO/RH(D): B POS
ANTIBODY SCREEN: NEGATIVE
UNIT DIVISION: 0
Unit division: 0

## 2015-07-15 LAB — POCT I-STAT 7, (LYTES, BLD GAS, ICA,H+H)
Acid-base deficit: 1 mmol/L (ref 0.0–2.0)
Bicarbonate: 24.7 meq/L — ABNORMAL HIGH (ref 20.0–24.0)
CALCIUM ION: 1.05 mmol/L — AB (ref 1.12–1.23)
HEMATOCRIT: 30 % — AB (ref 39.0–52.0)
Hemoglobin: 10.2 g/dL — ABNORMAL LOW (ref 13.0–17.0)
O2 SAT: 100 %
PH ART: 7.354 (ref 7.350–7.450)
POTASSIUM: 3.5 mmol/L (ref 3.5–5.1)
SODIUM: 147 mmol/L — AB (ref 135–145)
TCO2: 26 mmol/L (ref 0–100)
pCO2 arterial: 44.2 mmHg (ref 35.0–45.0)
pO2, Arterial: 533 mmHg — ABNORMAL HIGH (ref 80.0–100.0)

## 2015-07-15 LAB — BLOOD PRODUCT ORDER (VERBAL) VERIFICATION

## 2015-07-15 LAB — ABO/RH: ABO/RH(D): B POS

## 2015-07-15 LAB — PROTIME-INR
INR: 0.99 (ref 0.00–1.49)
INR: 1.09 (ref 0.00–1.49)
PROTHROMBIN TIME: 13.3 s (ref 11.6–15.2)
PROTHROMBIN TIME: 14.3 s (ref 11.6–15.2)

## 2015-07-15 LAB — CDS SEROLOGY

## 2015-07-15 LAB — ETHANOL: Alcohol, Ethyl (B): <5 mg/dL (ref <5)

## 2015-07-15 SURGERY — OPEN REDUCTION INTERNAL FIXATION (ORIF) MANDIBULAR FRACTURE
Anesthesia: General | Site: Face

## 2015-07-15 MED ORDER — CEFAZOLIN SODIUM 1-5 GM-% IV SOLN
1.0000 g | Freq: Three times a day (TID) | INTRAVENOUS | Status: DC
  Filled 2015-07-15: qty 50

## 2015-07-15 MED ORDER — MIDAZOLAM HCL 5 MG/5ML IJ SOLN
INTRAMUSCULAR | Status: DC | PRN
  Administered 2015-07-15: 2 mg via INTRAVENOUS

## 2015-07-15 MED ORDER — PROPOFOL 1000 MG/100ML IV EMUL
INTRAVENOUS | Status: AC | PRN
  Administered 2015-07-15: 5 ug/kg/min via INTRAVENOUS

## 2015-07-15 MED ORDER — CEFAZOLIN SODIUM-DEXTROSE 2-3 GM-% IV SOLR
2.0000 g | Freq: Three times a day (TID) | INTRAVENOUS | Status: DC
  Administered 2015-07-15 – 2015-07-18 (×10): 2 g via INTRAVENOUS
  Filled 2015-07-15 (×14): qty 50

## 2015-07-15 MED ORDER — BISACODYL 10 MG RE SUPP: 10.0000 mg | Freq: Every day | RECTAL | Status: DC | PRN

## 2015-07-15 MED ORDER — PANTOPRAZOLE SODIUM 40 MG PO TBEC: 40.0000 mg | DELAYED_RELEASE_TABLET | Freq: Every day | ORAL | Status: DC

## 2015-07-15 MED ORDER — MIDAZOLAM HCL 2 MG/2ML IJ SOLN: 2.0000 mg | INTRAMUSCULAR | Status: DC | PRN

## 2015-07-15 MED ORDER — ONDANSETRON HCL 4 MG/2ML IJ SOLN
4.0000 mg | Freq: Once | INTRAMUSCULAR | Status: AC
Start: 2015-07-15 — End: 2015-07-15
  Administered 2015-07-15: 4 mg via INTRAVENOUS

## 2015-07-15 MED ORDER — MIDAZOLAM HCL 5 MG/5ML IJ SOLN
INTRAMUSCULAR | Status: AC | PRN
  Administered 2015-07-15: 5 mg via INTRAVENOUS

## 2015-07-15 MED ORDER — FENTANYL CITRATE (PF) 100 MCG/2ML IJ SOLN
INTRAMUSCULAR | Status: AC | PRN
  Administered 2015-07-15: 100 ug via INTRAVENOUS

## 2015-07-15 MED ORDER — SODIUM BICARBONATE 8.4 % IV SOLN
INTRAVENOUS | Status: DC | PRN
  Administered 2015-07-15: 100 meq via INTRAVENOUS

## 2015-07-15 MED ORDER — POTASSIUM CHLORIDE IN NACL 20-0.9 MEQ/L-% IV SOLN
INTRAVENOUS | Status: DC
  Administered 2015-07-15 (×2): via INTRAVENOUS
  Administered 2015-07-16: 100 mL/h via INTRAVENOUS
  Administered 2015-07-16 – 2015-07-17 (×3): via INTRAVENOUS
  Filled 2015-07-15 (×15): qty 1000

## 2015-07-15 MED ORDER — FENTANYL CITRATE (PF) 100 MCG/2ML IJ SOLN
INTRAMUSCULAR | Status: AC
  Filled 2015-07-15: qty 2

## 2015-07-15 MED ORDER — ETOMIDATE 2 MG/ML IV SOLN
INTRAVENOUS | Status: AC | PRN
  Administered 2015-07-15: 20 mg via INTRAVENOUS

## 2015-07-15 MED ORDER — SODIUM CHLORIDE 0.9 % IV SOLN
INTRAVENOUS | Status: AC | PRN
  Administered 2015-07-15: 1000 mL via INTRAVENOUS

## 2015-07-15 MED ORDER — ROCURONIUM BROMIDE 50 MG/5ML IV SOLN
INTRAVENOUS | Status: AC
  Filled 2015-07-15: qty 1

## 2015-07-15 MED ORDER — FENTANYL CITRATE (PF) 100 MCG/2ML IJ SOLN: 50.0000 ug | Freq: Once | INTRAMUSCULAR | Status: DC

## 2015-07-15 MED ORDER — ROCURONIUM BROMIDE 50 MG/5ML IV SOLN
INTRAVENOUS | Status: AC | PRN
  Administered 2015-07-15: 100 mg via INTRAVENOUS

## 2015-07-15 MED ORDER — CEPHALEXIN 500 MG PO CAPS: 500.0000 mg | ORAL_CAPSULE | Freq: Three times a day (TID) | ORAL | Status: DC

## 2015-07-15 MED ORDER — ONDANSETRON HCL 4 MG PO TABS: 4.0000 mg | ORAL_TABLET | Freq: Four times a day (QID) | ORAL | Status: DC | PRN

## 2015-07-15 MED ORDER — SODIUM CHLORIDE 0.9 % IV SOLN
25.0000 ug/h | INTRAVENOUS | Status: DC
  Administered 2015-07-15: 50 ug/h via INTRAVENOUS
  Administered 2015-07-16: 100 ug/h via INTRAVENOUS
  Filled 2015-07-15 (×2): qty 50

## 2015-07-15 MED ORDER — SUCCINYLCHOLINE CHLORIDE 20 MG/ML IJ SOLN
INTRAMUSCULAR | Status: AC
  Filled 2015-07-15: qty 1

## 2015-07-15 MED ORDER — FENTANYL CITRATE (PF) 100 MCG/2ML IJ SOLN
100.0000 ug | Freq: Once | INTRAMUSCULAR | Status: AC
  Administered 2015-07-15: 100 ug via INTRAVENOUS
  Filled 2015-07-15: qty 2

## 2015-07-15 MED ORDER — 0.9 % SODIUM CHLORIDE (POUR BTL) OPTIME
TOPICAL | Status: DC | PRN
  Administered 2015-07-15: 1000 mL

## 2015-07-15 MED ORDER — SUCCINYLCHOLINE CHLORIDE 20 MG/ML IJ SOLN
INTRAMUSCULAR | Status: AC | PRN
  Administered 2015-07-15: 150 mg via INTRAVENOUS

## 2015-07-15 MED ORDER — PROPOFOL 1000 MG/100ML IV EMUL
INTRAVENOUS | Status: AC
  Administered 2015-07-15: 25 ug/kg/min via INTRAVENOUS
  Filled 2015-07-15: qty 100

## 2015-07-15 MED ORDER — ALBUMIN HUMAN 5 % IV SOLN
25.0000 g | Freq: Once | INTRAVENOUS | Status: AC
  Administered 2015-07-15: 25 g via INTRAVENOUS
  Filled 2015-07-15: qty 250

## 2015-07-15 MED ORDER — LACTATED RINGERS IV SOLN
INTRAVENOUS | Status: DC | PRN
  Administered 2015-07-15 (×2): via INTRAVENOUS

## 2015-07-15 MED ORDER — BACITRACIN-NEOMYCIN-POLYMYXIN OINTMENT TUBE
TOPICAL_OINTMENT | CUTANEOUS | Status: DC | PRN
  Administered 2015-07-15: 1 via TOPICAL

## 2015-07-15 MED ORDER — FENTANYL BOLUS VIA INFUSION
50.0000 ug | INTRAVENOUS | Status: DC | PRN
  Filled 2015-07-15: qty 50

## 2015-07-15 MED ORDER — ARTIFICIAL TEARS OP OINT
TOPICAL_OINTMENT | OPHTHALMIC | Status: DC | PRN
  Administered 2015-07-15: 1 via OPHTHALMIC

## 2015-07-15 MED ORDER — ONDANSETRON HCL 4 MG/2ML IJ SOLN
4.0000 mg | Freq: Four times a day (QID) | INTRAMUSCULAR | Status: DC | PRN
Start: 2015-07-15 — End: 2015-07-18

## 2015-07-15 MED ORDER — HYDROGEN PEROXIDE 3 % EX SOLN
CUTANEOUS | Status: DC | PRN
  Administered 2015-07-15: 1 via TOPICAL

## 2015-07-15 MED ORDER — CHLORHEXIDINE GLUCONATE 0.12% ORAL RINSE (MEDLINE KIT)
15.0000 mL | Freq: Two times a day (BID) | OROMUCOSAL | Status: DC
  Administered 2015-07-15 – 2015-07-18 (×4): 15 mL via OROMUCOSAL

## 2015-07-15 MED ORDER — MIDAZOLAM HCL 2 MG/2ML IJ SOLN
INTRAMUSCULAR | Status: AC
  Filled 2015-07-15: qty 6

## 2015-07-15 MED ORDER — SODIUM CHLORIDE 0.9 % IV SOLN
INTRAVENOUS | Status: DC | PRN
  Administered 2015-07-15: 06:00:00 via INTRAVENOUS

## 2015-07-15 MED ORDER — FENTANYL CITRATE (PF) 100 MCG/2ML IJ SOLN
INTRAMUSCULAR | Status: DC | PRN
  Administered 2015-07-15 (×2): 100 ug via INTRAVENOUS
  Administered 2015-07-15: 50 ug via INTRAVENOUS

## 2015-07-15 MED ORDER — CEFAZOLIN SODIUM-DEXTROSE 2-3 GM-% IV SOLR
INTRAVENOUS | Status: DC | PRN
  Administered 2015-07-15: 2 g via INTRAVENOUS

## 2015-07-15 MED ORDER — TETANUS-DIPHTH-ACELL PERTUSSIS 5-2.5-18.5 LF-MCG/0.5 IM SUSP
0.5000 mL | Freq: Once | INTRAMUSCULAR | Status: AC
  Administered 2015-07-15: 0.5 mL via INTRAMUSCULAR
  Filled 2015-07-15: qty 0.5

## 2015-07-15 MED ORDER — PANTOPRAZOLE SODIUM 40 MG IV SOLR
40.0000 mg | Freq: Every day | INTRAVENOUS | Status: DC
  Administered 2015-07-15 – 2015-07-18 (×4): 40 mg via INTRAVENOUS
  Filled 2015-07-15 (×4): qty 40

## 2015-07-15 MED ORDER — PROPOFOL 1000 MG/100ML IV EMUL
0.0000 ug/kg/min | INTRAVENOUS | Status: DC
  Administered 2015-07-15 – 2015-07-16 (×3): 25 ug/kg/min via INTRAVENOUS
  Filled 2015-07-15 (×3): qty 100

## 2015-07-15 MED ORDER — PROPOFOL 10 MG/ML IV BOLUS
INTRAVENOUS | Status: AC | PRN
  Administered 2015-07-15: 50 mg via INTRAVENOUS

## 2015-07-15 MED ORDER — SODIUM CHLORIDE 0.9 % IV SOLN
INTRAVENOUS | Status: AC | PRN
  Administered 2015-07-15: 1000 mL via INTRAVENOUS
  Administered 2015-07-15: 2000 mL via INTRAVENOUS

## 2015-07-15 MED ORDER — MIDAZOLAM HCL 2 MG/2ML IJ SOLN
2.0000 mg | INTRAMUSCULAR | Status: DC | PRN
  Filled 2015-07-15: qty 2

## 2015-07-15 MED ORDER — MIDAZOLAM HCL 2 MG/2ML IJ SOLN
INTRAMUSCULAR | Status: AC
  Filled 2015-07-15: qty 2

## 2015-07-15 MED ORDER — FENTANYL CITRATE (PF) 250 MCG/5ML IJ SOLN
INTRAMUSCULAR | Status: AC
  Filled 2015-07-15: qty 5

## 2015-07-15 MED ORDER — BACITRACIN-NEOMYCIN-POLYMYXIN 400-5-5000 EX OINT
TOPICAL_OINTMENT | CUTANEOUS | Status: AC
  Filled 2015-07-15: qty 1

## 2015-07-15 MED ORDER — ROCURONIUM BROMIDE 100 MG/10ML IV SOLN
INTRAVENOUS | Status: DC | PRN
  Administered 2015-07-15: 50 mg via INTRAVENOUS
  Administered 2015-07-15 (×2): 30 mg via INTRAVENOUS
  Administered 2015-07-15: 20 mg via INTRAVENOUS

## 2015-07-15 MED ORDER — PHENYLEPHRINE HCL 10 MG/ML IJ SOLN
10.0000 mg | INTRAVENOUS | Status: DC | PRN
  Administered 2015-07-15: 25 ug/min via INTRAVENOUS

## 2015-07-15 MED ORDER — ANTISEPTIC ORAL RINSE SOLUTION (CORINZ)
7.0000 mL | Freq: Four times a day (QID) | OROMUCOSAL | Status: DC
  Administered 2015-07-15 – 2015-07-16 (×5): 7 mL via OROMUCOSAL

## 2015-07-15 SURGICAL SUPPLY — 45 items
BIT DRILL TWIST 1.6X58MM (BIT) IMPLANT
BLADE 10 SAFETY STRL DISP (BLADE) ×3 IMPLANT
BLADE SURG 15 STRL LF DISP TIS (BLADE) ×1 IMPLANT
BLADE SURG 15 STRL SS (BLADE) ×3
BLADE SURG ROTATE 9660 (MISCELLANEOUS) IMPLANT
CANISTER SUCTION 2500CC (MISCELLANEOUS) ×3 IMPLANT
CLEANER TIP ELECTROSURG 2X2 (MISCELLANEOUS) ×3 IMPLANT
COVER SURGICAL LIGHT HANDLE (MISCELLANEOUS) ×3 IMPLANT
CRADLE DONUT ADULT HEAD (MISCELLANEOUS) ×3 IMPLANT
DRILL TWIST 1.6X58MM (BIT) ×3
ELECT COATED BLADE 2.86 ST (ELECTRODE) ×3 IMPLANT
ELECT REM PT RETURN 9FT ADLT (ELECTROSURGICAL) ×3
ELECTRODE REM PT RTRN 9FT ADLT (ELECTROSURGICAL) ×1 IMPLANT
GAUZE SPONGE 4X4 12PLY STRL (GAUZE/BANDAGES/DRESSINGS) ×2 IMPLANT
GLOVE BIO SURGEON STRL SZ 6.5 (GLOVE) ×2 IMPLANT
GLOVE BIO SURGEONS STRL SZ 6.5 (GLOVE) ×1
GLOVE BIOGEL PI IND STRL 8 (GLOVE) IMPLANT
GLOVE BIOGEL PI INDICATOR 8 (GLOVE) ×2
GLOVE SURG SS PI 8.0 STRL IVOR (GLOVE) ×2 IMPLANT
GOWN STRL REUS W/ TWL LRG LVL3 (GOWN DISPOSABLE) ×2 IMPLANT
GOWN STRL REUS W/ TWL XL LVL3 (GOWN DISPOSABLE) IMPLANT
GOWN STRL REUS W/TWL LRG LVL3 (GOWN DISPOSABLE) ×6
GOWN STRL REUS W/TWL XL LVL3 (GOWN DISPOSABLE) ×3
HOLDER TRACH TUBE VELCRO 19.5 (MISCELLANEOUS) ×2 IMPLANT
KIT BASIN OR (CUSTOM PROCEDURE TRAY) ×3 IMPLANT
KIT ROOM TURNOVER OR (KITS) ×3 IMPLANT
NEEDLE 27GAX1X1/2 (NEEDLE) ×3 IMPLANT
NS IRRIG 1000ML POUR BTL (IV SOLUTION) ×3 IMPLANT
PAD ARMBOARD 7.5X6 YLW CONV (MISCELLANEOUS) ×6 IMPLANT
PENCIL BUTTON HOLSTER BLD 10FT (ELECTRODE) ×3 IMPLANT
SCISSORS WIRE ANG 4 3/4 DISP (INSTRUMENTS) ×3 IMPLANT
SPONGE GAUZE 4X4 12PLY STER LF (GAUZE/BANDAGES/DRESSINGS) ×2 IMPLANT
SPONGE LAP 18X18 X RAY DECT (DISPOSABLE) ×2 IMPLANT
SUT MON AB 5-0 PS2 18 (SUTURE) ×22 IMPLANT
SUT STEEL 0 (SUTURE)
SUT STEEL 0 18XMFL TIE 17 (SUTURE) IMPLANT
SUT STEEL 1 (SUTURE) IMPLANT
SUT STEEL 2 (SUTURE) IMPLANT
SUT STEEL 4 (SUTURE) IMPLANT
SUT VIC AB 3-0 FS2 27 (SUTURE) ×4 IMPLANT
SUT VICRYL 4-0 PS2 18IN ABS (SUTURE) ×12 IMPLANT
TOWEL OR 17X24 6PK STRL BLUE (TOWEL DISPOSABLE) ×3 IMPLANT
TOWEL OR 17X26 10 PK STRL BLUE (TOWEL DISPOSABLE) ×3 IMPLANT
TRAY ENT MC OR (CUSTOM PROCEDURE TRAY) ×3 IMPLANT
YANKAUER SUCT BULB TIP NO VENT (SUCTIONS) ×2 IMPLANT

## 2015-07-15 NOTE — Anesthesia Postprocedure Evaluation (Signed)
Anesthesia Post Note  Patient: Cody Blake  Procedure(s) Performed: Procedure(s) (LRB): EXPLORATION FACIAL INJURY (N/A)  Patient location during evaluation: ICU Anesthesia Type: General Level of consciousness: sedated and patient remains intubated per anesthesia plan Pain management: pain level controlled Vital Signs Assessment: post-procedure vital signs reviewed and stable Respiratory status: patient remains intubated per anesthesia plan Cardiovascular status: stable Anesthetic complications: no    Last Vitals:  Filed Vitals:   07/15/15 0500 07/15/15 0900  BP: 124/80 124/69  Pulse: 77 77  Temp:    Resp: 22 22    Last Pain:  Filed Vitals:   07/15/15 0906  PainSc: 10-Worst pain ever                 Sundeep Destin,JAMES TERRILL
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
## 2015-07-15 NOTE — ED Notes (Signed)
Dr wyatt preparing to trach pt.

## 2015-07-15 NOTE — Transfer of Care (Signed)
Immediate Anesthesia Transfer of Care Note  Patient: Cody Blake  Procedure(s) Performed: Procedure(s): EXPLORATION FACIAL INJURY (N/A)  Patient Location: ICU  Anesthesia Type:General  Level of Consciousness: Patient remains intubated per anesthesia plan  Airway & Oxygen Therapy: Patient remains intubated per anesthesia plan and Patient placed on Ventilator (see vital sign flow sheet for setting)  Post-op Assessment: Report given to RN  Post vital signs: Reviewed and stable  Last Vitals:  Filed Vitals:   07/15/15 0445 07/15/15 0500  BP: 118/75 124/80  Pulse: 78 77  Temp:    Resp: 22 22    Complications: No apparent anesthesia complications
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
## 2015-07-15 NOTE — Anesthesia Preprocedure Evaluation (Signed)
Anesthesia Evaluation  Patient identified by MRN, date of birth, ID band Patient unresponsive    Reviewed: Unable to perform ROS - Chart review onlyPreop documentation limited or incomplete due to emergent nature of procedure.  Airway Mallampati: Trach       Dental   Pulmonary    Pulmonary exam normal        Cardiovascular Normal cardiovascular exam     Neuro/Psych    GI/Hepatic   Endo/Other    Renal/GU      Musculoskeletal   Abdominal   Peds  Hematology   Anesthesia Other Findings   Reproductive/Obstetrics                             Anesthesia Physical Anesthesia Plan  ASA: III and emergent  Anesthesia Plan: General   Post-op Pain Management:    Induction: Intravenous  Airway Management Planned: Tracheostomy  Additional Equipment:   Intra-op Plan:   Post-operative Plan: Post-operative intubation/ventilation  Informed Consent: I have reviewed the patients History and Physical, chart, labs and discussed the procedure including the risks, benefits and alternatives for the proposed anesthesia with the patient or authorized representative who has indicated his/her understanding and acceptance.     Plan Discussed with: CRNA and Surgeon  Anesthesia Plan Comments:         Anesthesia Quick Evaluation

## 2015-07-15 NOTE — ED Notes (Signed)
Pt in CT/ vss.
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
## 2015-07-15 NOTE — Progress Notes (Deleted)
Patient is tolerating 40% Trach collar well. Patient sat 98% and RR 21. Patient is in no distress. Vent not needed at this time. RT will continue to monitor.
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
## 2015-07-15 NOTE — Progress Notes (Signed)
Orthopedic Tech Progress Note Patient Details:  Cody Blake Sep 22, 1960 [id number]  Ortho Devices Type of Ortho Device: CAM walker Ortho Device/Splint Location: lle Ortho Device/Splint Interventions: Application   [name] 07/15/2015, 8:39 AM
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
## 2015-07-16 ENCOUNTER — Encounter (HOSPITAL_COMMUNITY): Payer: Self-pay | Admitting: Plastic Surgery

## 2015-07-16 ENCOUNTER — Inpatient Hospital Stay (HOSPITAL_COMMUNITY): Payer: 59

## 2015-07-16 DIAGNOSIS — T1491XA Suicide attempt, initial encounter: Secondary | ICD-10-CM | POA: Diagnosis present

## 2015-07-16 DIAGNOSIS — D62 Acute posthemorrhagic anemia: Secondary | ICD-10-CM | POA: Diagnosis not present

## 2015-07-16 DIAGNOSIS — S0292XA Unspecified fracture of facial bones, initial encounter for closed fracture: Secondary | ICD-10-CM | POA: Diagnosis present

## 2015-07-16 DIAGNOSIS — J9600 Acute respiratory failure, unspecified whether with hypoxia or hypercapnia: Secondary | ICD-10-CM | POA: Diagnosis present

## 2015-07-16 DIAGNOSIS — S92002A Unspecified fracture of left calcaneus, initial encounter for closed fracture: Secondary | ICD-10-CM | POA: Diagnosis present

## 2015-07-16 DIAGNOSIS — W3400XA Accidental discharge from unspecified firearms or gun, initial encounter: Secondary | ICD-10-CM

## 2015-07-16 DIAGNOSIS — S91339A Puncture wound without foreign body, unspecified foot, initial encounter: Secondary | ICD-10-CM

## 2015-07-16 LAB — POCT I-STAT 7, (LYTES, BLD GAS, ICA,H+H)
ACID-BASE DEFICIT: 7 mmol/L — AB (ref 0.0–2.0)
Bicarbonate: 21.4 meq/L (ref 20.0–24.0)
CALCIUM ION: 1.17 mmol/L (ref 1.12–1.23)
HEMATOCRIT: 35 % — AB (ref 39.0–52.0)
HEMOGLOBIN: 11.9 g/dL — AB (ref 13.0–17.0)
O2 SAT: 100 %
POTASSIUM: 4.1 mmol/L (ref 3.5–5.1)
SODIUM: 145 mmol/L (ref 135–145)
TCO2: 23 mmol/L (ref 0–100)
pCO2 arterial: 54.7 mmHg — ABNORMAL HIGH (ref 35.0–45.0)
pH, Arterial: 7.201 — ABNORMAL LOW (ref 7.350–7.450)
pO2, Arterial: 478 mmHg — ABNORMAL HIGH (ref 80.0–100.0)

## 2015-07-16 LAB — GLUCOSE, CAPILLARY: Glucose-Capillary: 86 mg/dL (ref 65–99)

## 2015-07-16 MED ORDER — DIPHENHYDRAMINE HCL 50 MG/ML IJ SOLN: 12.5000 mg | Freq: Four times a day (QID) | INTRAMUSCULAR | Status: DC | PRN

## 2015-07-16 MED ORDER — SODIUM CHLORIDE 0.9% FLUSH: 9.0000 mL | INTRAVENOUS | Status: DC | PRN

## 2015-07-16 MED ORDER — HYDROMORPHONE 1 MG/ML IV SOLN
INTRAVENOUS | Status: DC
  Administered 2015-07-16: 2.7 mg via INTRAVENOUS
  Administered 2015-07-16: 4.8 mg via INTRAVENOUS
  Administered 2015-07-16: 13:00:00 via INTRAVENOUS
  Administered 2015-07-17: 2.7 mg via INTRAVENOUS
  Administered 2015-07-17: 1.2 mg via INTRAVENOUS
  Administered 2015-07-17: 2.7 mg via INTRAVENOUS
  Administered 2015-07-17: 3.9 mg via INTRAVENOUS
  Administered 2015-07-17: 2.7 mg via INTRAVENOUS
  Administered 2015-07-18: 2.4 mg via INTRAVENOUS
  Administered 2015-07-18: 2.1 mg via INTRAVENOUS
  Filled 2015-07-16 (×2): qty 25

## 2015-07-16 MED ORDER — PIVOT 1.5 CAL PO LIQD
1000.0000 mL | ORAL | Status: DC
  Administered 2015-07-16 – 2015-07-18 (×3): 1000 mL

## 2015-07-16 MED ORDER — ANTISEPTIC ORAL RINSE SOLUTION (CORINZ)
7.0000 mL | OROMUCOSAL | Status: DC
  Administered 2015-07-16 (×7): 7 mL via OROMUCOSAL

## 2015-07-16 MED ORDER — NALOXONE HCL 0.4 MG/ML IJ SOLN: 0.4000 mg | INTRAMUSCULAR | Status: DC | PRN

## 2015-07-16 MED ORDER — DIPHENHYDRAMINE HCL 12.5 MG/5ML PO ELIX: 12.5000 mg | ORAL_SOLUTION | Freq: Four times a day (QID) | ORAL | Status: DC | PRN

## 2015-07-16 MED ORDER — CETYLPYRIDINIUM CHLORIDE 0.05 % MT LIQD
7.0000 mL | Freq: Two times a day (BID) | OROMUCOSAL | Status: DC
  Administered 2015-07-17 – 2015-07-18 (×3): 7 mL via OROMUCOSAL

## 2015-07-16 MED ORDER — ENOXAPARIN SODIUM 40 MG/0.4ML ~~LOC~~ SOLN
40.0000 mg | SUBCUTANEOUS | Status: DC
  Administered 2015-07-16 – 2015-07-18 (×3): 40 mg via SUBCUTANEOUS
  Filled 2015-07-16 (×3): qty 0.4

## 2015-07-16 MED ORDER — PIVOT 1.5 CAL PO LIQD: 1000.0000 mL | ORAL | Status: DC

## 2015-07-16 MED ORDER — CETYLPYRIDINIUM CHLORIDE 0.05 % MT LIQD: 7.0000 mL | Freq: Two times a day (BID) | OROMUCOSAL | Status: DC
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
## 2015-07-16 NOTE — Progress Notes (Signed)
Patient is tolerating Trach collar 5L 30%. Patient RR is 19 and SPO2 is 96%. Patient is in no distress. Vent is not needed at this time. Rt will continue to monitor.

## 2015-07-16 NOTE — Progress Notes (Signed)
Wasted  of Fentanyl with Jarome Lamas, RN.

## 2015-07-17 LAB — CBC
HCT: 24.8 % — ABNORMAL LOW (ref 39.0–52.0)
HEMOGLOBIN: 8.3 g/dL — AB (ref 13.0–17.0)
MCH: 34.7 pg — AB (ref 26.0–34.0)
MCHC: 33.5 g/dL (ref 30.0–36.0)
MCV: 103.8 fL — AB (ref 78.0–100.0)
Platelets: 118 10*3/uL — ABNORMAL LOW (ref 150–400)
RBC: 2.39 MIL/uL — AB (ref 4.22–5.81)
RDW: 13.5 % (ref 11.5–15.5)
WBC: 15.1 10*3/uL — ABNORMAL HIGH (ref 4.0–10.5)

## 2015-07-17 LAB — GLUCOSE, CAPILLARY
GLUCOSE-CAPILLARY: 112 mg/dL — AB (ref 65–99)
GLUCOSE-CAPILLARY: 145 mg/dL — AB (ref 65–99)
GLUCOSE-CAPILLARY: 148 mg/dL — AB (ref 65–99)
GLUCOSE-CAPILLARY: 87 mg/dL (ref 65–99)
Glucose-Capillary: 145 mg/dL — ABNORMAL HIGH (ref 65–99)
Glucose-Capillary: 130 mg/dL — ABNORMAL HIGH (ref 65–99)

## 2015-07-17 MED ORDER — BACITRACIN-NEOMYCIN-POLYMYXIN OINTMENT TUBE
TOPICAL_OINTMENT | Freq: Two times a day (BID) | CUTANEOUS | Status: DC
Start: 2015-07-18 — End: 2015-07-18
  Administered 2015-07-18 (×2): via TOPICAL
  Filled 2015-07-17: qty 15

## 2015-07-17 MED ORDER — OXYCODONE HCL 5 MG/5ML PO SOLN
5.0000 mg | ORAL | Status: DC | PRN
  Administered 2015-07-17 – 2015-07-18 (×3): 10 mg via ORAL
  Filled 2015-07-17 (×3): qty 10
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
## 2015-07-17 NOTE — Progress Notes (Signed)
PT Cancellation Note  Patient Details Name: Cody Blake MRN: [id number] DOB: January 05, 1961   Cancelled Treatment:    Reason Eval/Treat Not Completed: Patient not medically ready.  Spoke with RN who indicates pt is not appropriate at this time and RN is anticipating transfer to [facility] either today or tomorrow.  Will f/u as appropriate.     Sunny Schlein, [place] [phone] 07/17/2015, 9:59 AM

## 2015-07-17 NOTE — Progress Notes (Signed)
OT Cancellation Note  Patient Details Name: Cody Blake MRN: [id number] DOB: 05-28-1960   Cancelled Treatment:    Reason Eval/Treat Not Completed: Patient not medically ready  Angelene Giovanni Stephan, OTR/L [phone]   07/17/2015, 10:41 AM

## 2015-07-18 DIAGNOSIS — W3400XA Accidental discharge from unspecified firearms or gun, initial encounter: Secondary | ICD-10-CM

## 2015-07-18 DIAGNOSIS — R45851 Suicidal ideations: Secondary | ICD-10-CM

## 2015-07-18 DIAGNOSIS — S0180XA Unspecified open wound of other part of head, initial encounter: Secondary | ICD-10-CM

## 2015-07-18 DIAGNOSIS — T1491 Suicide attempt: Secondary | ICD-10-CM

## 2015-07-18 DIAGNOSIS — S91309A Unspecified open wound, unspecified foot, initial encounter: Secondary | ICD-10-CM

## 2015-07-18 LAB — CBC
HEMATOCRIT: 24.5 % — AB (ref 39.0–52.0)
HEMOGLOBIN: 8.1 g/dL — AB (ref 13.0–17.0)
MCH: 33.1 pg (ref 26.0–34.0)
MCHC: 33.1 g/dL (ref 30.0–36.0)
MCV: 100 fL (ref 78.0–100.0)
Platelets: 141 10*3/uL — ABNORMAL LOW (ref 150–400)
RBC: 2.45 MIL/uL — AB (ref 4.22–5.81)
RDW: 13 % (ref 11.5–15.5)
WBC: 12.7 10*3/uL — ABNORMAL HIGH (ref 4.0–10.5)

## 2015-07-18 LAB — GLUCOSE, CAPILLARY
GLUCOSE-CAPILLARY: 131 mg/dL — AB (ref 65–99)
GLUCOSE-CAPILLARY: 134 mg/dL — AB (ref 65–99)
Glucose-Capillary: 136 mg/dL — ABNORMAL HIGH (ref 65–99)
Glucose-Capillary: 149 mg/dL — ABNORMAL HIGH (ref 65–99)

## 2015-07-18 LAB — BASIC METABOLIC PANEL
Anion gap: 6 (ref 5–15)
BUN: 12 mg/dL (ref 6–20)
CALCIUM: 7.6 mg/dL — AB (ref 8.9–10.3)
CHLORIDE: 116 mmol/L — AB (ref 101–111)
CO2: 19 mmol/L — ABNORMAL LOW (ref 22–32)
Creatinine, Ser: 0.6 mg/dL — ABNORMAL LOW (ref 0.61–1.24)
GLUCOSE: 147 mg/dL — AB (ref 65–99)
POTASSIUM: 3.4 mmol/L — AB (ref 3.5–5.1)
SODIUM: 141 mmol/L (ref 135–145)

## 2015-07-18 LAB — TRIGLYCERIDES: Triglycerides: 84 mg/dL (ref <150)

## 2015-07-18 MED ORDER — BACITRACIN-NEOMYCIN-POLYMYXIN OINTMENT TUBE: 1.0000 "application " | TOPICAL_OINTMENT | Freq: Two times a day (BID) | CUTANEOUS | Status: DC

## 2015-07-18 MED ORDER — CHLORHEXIDINE GLUCONATE 0.12% ORAL RINSE (MEDLINE KIT): 15.0000 mL | Freq: Two times a day (BID) | OROMUCOSAL | Status: DC

## 2015-07-18 MED ORDER — HYDROMORPHONE HCL 1 MG/ML IJ SOLN: 1.0000 mg | INTRAMUSCULAR | Status: DC | PRN

## 2015-07-18 MED ORDER — CETYLPYRIDINIUM CHLORIDE 0.05 % MT LIQD: 7.0000 mL | Freq: Two times a day (BID) | OROMUCOSAL | Status: DC

## 2015-07-18 MED ORDER — PANTOPRAZOLE SODIUM 40 MG IV SOLR: 40.0000 mg | Freq: Every day | INTRAVENOUS | Status: DC

## 2015-07-18 MED ORDER — HYDROMORPHONE HCL 1 MG/ML IJ SOLN
1.0000 mg | INTRAMUSCULAR | Status: DC | PRN
  Administered 2015-07-18: 1 mg via INTRAVENOUS
  Filled 2015-07-18: qty 1

## 2015-07-18 MED ORDER — CEFAZOLIN SODIUM-DEXTROSE 2-3 GM-% IV SOLR: 2.0000 g | Freq: Three times a day (TID) | INTRAVENOUS | Status: DC

## 2015-07-18 MED ORDER — ENOXAPARIN SODIUM 40 MG/0.4ML ~~LOC~~ SOLN: 40.0000 mg | SUBCUTANEOUS | Status: DC

## 2015-07-18 MED ORDER — ONDANSETRON HCL 4 MG/2ML IJ SOLN: 4.0000 mg | Freq: Four times a day (QID) | INTRAMUSCULAR | Status: DC | PRN

## 2015-07-18 MED ORDER — PIVOT 1.5 CAL PO LIQD: 1000.0000 mL | ORAL | Status: DC

## 2015-07-18 MED ORDER — POTASSIUM CHLORIDE IN NACL 20-0.9 MEQ/L-% IV SOLN: 100.0000 mL/h | INTRAVENOUS | Status: DC

## 2015-07-18 NOTE — Progress Notes (Signed)
MEDICATION RELATED NOTE   Pharmacy Re:  Home Meds  We have made 6 different attempts to find out this patients home medications but have not been successful.  We have no pharmacy documented for him and have not been able to get any information from anyone else.  Plan:  - I have marked his admission as completed with a note of "No known prior to admit medications".  Please let us know if you are able to confirm any and we will be happy to complete his medication history again.  Nadara MustardNita Skylee Baird, PharmD., MS Clinical Pharmacist Pager:  [phone] Thank you for allowing pharmacy to be part of this patients care team. 07/18/2015,2:05 PM
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
## 2015-07-18 NOTE — Progress Notes (Signed)
12 mg Dilaudid wasted from PCA, witnessed by Tawni MillersKim Mehyren, RN.
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
## 2015-07-28 ENCOUNTER — Encounter (HOSPITAL_COMMUNITY): Payer: Self-pay | Admitting: Registered Nurse

## 2017-08-26 ENCOUNTER — Inpatient Hospital Stay (HOSPITAL_COMMUNITY)
Admission: EM | Admit: 2017-08-26 | Discharge: 2017-08-29 | DRG: 190 | Attending: Internal Medicine | Admitting: Internal Medicine

## 2017-08-26 DIAGNOSIS — G2581 Restless legs syndrome: Secondary | ICD-10-CM | POA: Diagnosis present

## 2017-08-26 DIAGNOSIS — G9340 Encephalopathy, unspecified: Secondary | ICD-10-CM | POA: Diagnosis present

## 2017-08-26 DIAGNOSIS — K219 Gastro-esophageal reflux disease without esophagitis: Secondary | ICD-10-CM | POA: Diagnosis present

## 2017-08-26 DIAGNOSIS — R4701 Aphasia: Secondary | ICD-10-CM | POA: Diagnosis present

## 2017-08-26 DIAGNOSIS — E876 Hypokalemia: Secondary | ICD-10-CM | POA: Diagnosis present

## 2017-08-26 DIAGNOSIS — L89222 Pressure ulcer of left hip, stage 2: Secondary | ICD-10-CM | POA: Diagnosis present

## 2017-08-26 DIAGNOSIS — F319 Bipolar disorder, unspecified: Secondary | ICD-10-CM | POA: Diagnosis present

## 2017-08-26 DIAGNOSIS — Z7952 Long term (current) use of systemic steroids: Secondary | ICD-10-CM

## 2017-08-26 DIAGNOSIS — J9601 Acute respiratory failure with hypoxia: Secondary | ICD-10-CM | POA: Diagnosis not present

## 2017-08-26 DIAGNOSIS — F419 Anxiety disorder, unspecified: Secondary | ICD-10-CM | POA: Diagnosis present

## 2017-08-26 DIAGNOSIS — Z96652 Presence of left artificial knee joint: Secondary | ICD-10-CM | POA: Diagnosis present

## 2017-08-26 DIAGNOSIS — Z915 Personal history of self-harm: Secondary | ICD-10-CM

## 2017-08-26 DIAGNOSIS — Z7951 Long term (current) use of inhaled steroids: Secondary | ICD-10-CM

## 2017-08-26 DIAGNOSIS — R296 Repeated falls: Secondary | ICD-10-CM | POA: Diagnosis present

## 2017-08-26 DIAGNOSIS — R627 Adult failure to thrive: Secondary | ICD-10-CM | POA: Diagnosis present

## 2017-08-26 DIAGNOSIS — J9600 Acute respiratory failure, unspecified whether with hypoxia or hypercapnia: Secondary | ICD-10-CM | POA: Diagnosis present

## 2017-08-26 DIAGNOSIS — J441 Chronic obstructive pulmonary disease with (acute) exacerbation: Principal | ICD-10-CM | POA: Diagnosis present

## 2017-08-26 DIAGNOSIS — Z79899 Other long term (current) drug therapy: Secondary | ICD-10-CM

## 2017-08-26 DIAGNOSIS — L8990 Pressure ulcer of unspecified site, unspecified stage: Secondary | ICD-10-CM | POA: Diagnosis present

## 2017-08-26 DIAGNOSIS — F29 Unspecified psychosis not due to a substance or known physiological condition: Secondary | ICD-10-CM | POA: Diagnosis present

## 2017-08-26 DIAGNOSIS — S0292XA Unspecified fracture of facial bones, initial encounter for closed fracture: Secondary | ICD-10-CM | POA: Diagnosis present

## 2017-08-26 DIAGNOSIS — R569 Unspecified convulsions: Secondary | ICD-10-CM

## 2017-08-26 DIAGNOSIS — L89211 Pressure ulcer of right hip, stage 1: Secondary | ICD-10-CM | POA: Diagnosis present

## 2017-08-26 DIAGNOSIS — G8929 Other chronic pain: Secondary | ICD-10-CM | POA: Diagnosis present

## 2017-08-26 DIAGNOSIS — Z801 Family history of malignant neoplasm of trachea, bronchus and lung: Secondary | ICD-10-CM

## 2017-08-26 HISTORY — DX: Unspecified convulsions: R56.9

## 2017-08-26 HISTORY — DX: Bipolar II disorder: F31.81

## 2017-08-26 NOTE — ED Triage Notes (Signed)
Per ems [place] [facility] [place] and for there past few months has been having more frequent falls and seizures. Pt was very difficult to get up with ems help. Pt has been laying around more and not wanting to get up. O2 stats have been in the high 80"s. 179/99 80 hr, 16 rr, 94 4L..Marland Kitchen

## 2017-08-27 ENCOUNTER — Emergency Department (HOSPITAL_COMMUNITY)

## 2017-08-27 ENCOUNTER — Encounter (HOSPITAL_COMMUNITY): Payer: Self-pay | Admitting: Family Medicine

## 2017-08-27 ENCOUNTER — Other Ambulatory Visit: Payer: Self-pay

## 2017-08-27 DIAGNOSIS — L89222 Pressure ulcer of left hip, stage 2: Secondary | ICD-10-CM | POA: Diagnosis present

## 2017-08-27 DIAGNOSIS — J9600 Acute respiratory failure, unspecified whether with hypoxia or hypercapnia: Secondary | ICD-10-CM

## 2017-08-27 DIAGNOSIS — Z801 Family history of malignant neoplasm of trachea, bronchus and lung: Secondary | ICD-10-CM | POA: Diagnosis not present

## 2017-08-27 DIAGNOSIS — L89211 Pressure ulcer of right hip, stage 1: Secondary | ICD-10-CM | POA: Diagnosis present

## 2017-08-27 DIAGNOSIS — J441 Chronic obstructive pulmonary disease with (acute) exacerbation: Secondary | ICD-10-CM | POA: Diagnosis present

## 2017-08-27 DIAGNOSIS — Z7951 Long term (current) use of inhaled steroids: Secondary | ICD-10-CM | POA: Diagnosis not present

## 2017-08-27 DIAGNOSIS — G8929 Other chronic pain: Secondary | ICD-10-CM | POA: Diagnosis present

## 2017-08-27 DIAGNOSIS — J9601 Acute respiratory failure with hypoxia: Secondary | ICD-10-CM | POA: Diagnosis present

## 2017-08-27 DIAGNOSIS — G2581 Restless legs syndrome: Secondary | ICD-10-CM

## 2017-08-27 DIAGNOSIS — E876 Hypokalemia: Secondary | ICD-10-CM | POA: Diagnosis present

## 2017-08-27 DIAGNOSIS — Z79899 Other long term (current) drug therapy: Secondary | ICD-10-CM | POA: Diagnosis not present

## 2017-08-27 DIAGNOSIS — K2960 Other gastritis without bleeding: Secondary | ICD-10-CM

## 2017-08-27 DIAGNOSIS — Z96652 Presence of left artificial knee joint: Secondary | ICD-10-CM | POA: Diagnosis present

## 2017-08-27 DIAGNOSIS — F319 Bipolar disorder, unspecified: Secondary | ICD-10-CM | POA: Diagnosis present

## 2017-08-27 DIAGNOSIS — R627 Adult failure to thrive: Secondary | ICD-10-CM | POA: Diagnosis present

## 2017-08-27 DIAGNOSIS — Z7952 Long term (current) use of systemic steroids: Secondary | ICD-10-CM | POA: Diagnosis not present

## 2017-08-27 DIAGNOSIS — R4701 Aphasia: Secondary | ICD-10-CM | POA: Diagnosis present

## 2017-08-27 DIAGNOSIS — Z915 Personal history of self-harm: Secondary | ICD-10-CM | POA: Diagnosis not present

## 2017-08-27 DIAGNOSIS — R296 Repeated falls: Secondary | ICD-10-CM | POA: Diagnosis present

## 2017-08-27 DIAGNOSIS — F419 Anxiety disorder, unspecified: Secondary | ICD-10-CM | POA: Diagnosis present

## 2017-08-27 DIAGNOSIS — G9340 Encephalopathy, unspecified: Secondary | ICD-10-CM | POA: Diagnosis present

## 2017-08-27 DIAGNOSIS — K219 Gastro-esophageal reflux disease without esophagitis: Secondary | ICD-10-CM | POA: Diagnosis present

## 2017-08-27 LAB — CBC
HCT: 43.1 % (ref 39.0–52.0)
Hemoglobin: 14.4 g/dL (ref 13.0–17.0)
MCH: 29.7 pg (ref 26.0–34.0)
MCHC: 33.4 g/dL (ref 30.0–36.0)
MCV: 88.9 fL (ref 78.0–100.0)
PLATELETS: 155 10*3/uL (ref 150–400)
RBC: 4.85 MIL/uL (ref 4.22–5.81)
RDW: 13.4 % (ref 11.5–15.5)
WBC: 6.6 10*3/uL (ref 4.0–10.5)

## 2017-08-27 LAB — COMPREHENSIVE METABOLIC PANEL
ALBUMIN: 3.9 g/dL (ref 3.5–5.0)
ALT: 17 U/L (ref 17–63)
AST: 19 U/L (ref 15–41)
Alkaline Phosphatase: 78 U/L (ref 38–126)
Anion gap: 13 (ref 5–15)
BUN: 8 mg/dL (ref 6–20)
CHLORIDE: 99 mmol/L — AB (ref 101–111)
CO2: 28 mmol/L (ref 22–32)
Calcium: 10.6 mg/dL — ABNORMAL HIGH (ref 8.9–10.3)
Creatinine, Ser: 1.02 mg/dL (ref 0.61–1.24)
GFR calc Af Amer: >60 mL/min (ref >60)
GFR calc non Af Amer: >60 mL/min (ref >60)
GLUCOSE: 128 mg/dL — AB (ref 65–99)
POTASSIUM: 3 mmol/L — AB (ref 3.5–5.1)
Sodium: 140 mmol/L (ref 135–145)
Total Bilirubin: 0.7 mg/dL (ref 0.3–1.2)
Total Protein: 7 g/dL (ref 6.5–8.1)

## 2017-08-27 LAB — MRSA PCR SCREENING: MRSA by PCR: NEGATIVE

## 2017-08-27 LAB — CBC WITH DIFFERENTIAL/PLATELET
BASOS ABS: 0 10*3/uL (ref 0.0–0.1)
BASOS PCT: 0 %
EOS PCT: 1 %
Eosinophils Absolute: 0.1 10*3/uL (ref 0.0–0.7)
HCT: 45 % (ref 39.0–52.0)
Hemoglobin: 15.2 g/dL (ref 13.0–17.0)
Lymphocytes Relative: 24 %
Lymphs Abs: 1.6 10*3/uL (ref 0.7–4.0)
MCH: 29.9 pg (ref 26.0–34.0)
MCHC: 33.8 g/dL (ref 30.0–36.0)
MCV: 88.6 fL (ref 78.0–100.0)
Monocytes Absolute: 0.6 10*3/uL (ref 0.1–1.0)
Monocytes Relative: 8 %
NEUTROS ABS: 4.5 10*3/uL (ref 1.7–7.7)
Neutrophils Relative %: 67 %
Platelets: 163 10*3/uL (ref 150–400)
RBC: 5.08 MIL/uL (ref 4.22–5.81)
RDW: 13.2 % (ref 11.5–15.5)
WBC: 6.7 10*3/uL (ref 4.0–10.5)

## 2017-08-27 LAB — CREATININE, SERUM
Creatinine, Ser: 0.98 mg/dL (ref 0.61–1.24)
GFR calc Af Amer: >60 mL/min (ref >60)
GFR calc non Af Amer: >60 mL/min (ref >60)

## 2017-08-27 LAB — MAGNESIUM: Magnesium: 1.7 mg/dL (ref 1.7–2.4)

## 2017-08-27 LAB — HIV ANTIBODY (ROUTINE TESTING W REFLEX): HIV Screen 4th Generation wRfx: NONREACTIVE

## 2017-08-27 LAB — INFLUENZA PANEL BY PCR (TYPE A & B)
Influenza A By PCR: NEGATIVE
Influenza B By PCR: NEGATIVE

## 2017-08-27 LAB — LIPASE, BLOOD: Lipase: 24 U/L (ref 11–51)

## 2017-08-27 LAB — PHOSPHORUS: Phosphorus: 2.8 mg/dL (ref 2.5–4.6)

## 2017-08-27 MED ORDER — LORAZEPAM 2 MG/ML IJ SOLN: 1.0000 mg | Freq: Four times a day (QID) | INTRAMUSCULAR | Status: DC | PRN

## 2017-08-27 MED ORDER — ONDANSETRON HCL 4 MG/2ML IJ SOLN: 4.0000 mg | Freq: Four times a day (QID) | INTRAMUSCULAR | Status: DC | PRN

## 2017-08-27 MED ORDER — IPRATROPIUM-ALBUTEROL 0.5-2.5 (3) MG/3ML IN SOLN
3.0000 mL | RESPIRATORY_TRACT | Status: AC
  Administered 2017-08-27 (×3): 3 mL via RESPIRATORY_TRACT
  Filled 2017-08-27: qty 3

## 2017-08-27 MED ORDER — ALBUTEROL SULFATE (2.5 MG/3ML) 0.083% IN NEBU: 2.5000 mg | INHALATION_SOLUTION | RESPIRATORY_TRACT | Status: DC | PRN

## 2017-08-27 MED ORDER — PANTOPRAZOLE SODIUM 40 MG PO TBEC
40.0000 mg | DELAYED_RELEASE_TABLET | Freq: Every day | ORAL | Status: DC
  Administered 2017-08-28 – 2017-08-29 (×2): 40 mg via ORAL
  Filled 2017-08-27 (×2): qty 1

## 2017-08-27 MED ORDER — ENOXAPARIN SODIUM 40 MG/0.4ML ~~LOC~~ SOLN
40.0000 mg | SUBCUTANEOUS | Status: DC
  Administered 2017-08-27 – 2017-08-29 (×3): 40 mg via SUBCUTANEOUS
  Filled 2017-08-27 (×4): qty 0.4

## 2017-08-27 MED ORDER — IPRATROPIUM-ALBUTEROL 0.5-2.5 (3) MG/3ML IN SOLN
3.0000 mL | Freq: Three times a day (TID) | RESPIRATORY_TRACT | Status: DC
  Administered 2017-08-28: 3 mL via RESPIRATORY_TRACT
  Filled 2017-08-27: qty 3

## 2017-08-27 MED ORDER — IOPAMIDOL (ISOVUE-370) INJECTION 76%
100.0000 mL | Freq: Once | INTRAVENOUS | Status: AC | PRN
  Administered 2017-08-27: 100 mL via INTRAVENOUS

## 2017-08-27 MED ORDER — ROPINIROLE HCL 1 MG PO TABS
4.0000 mg | ORAL_TABLET | Freq: Every day | ORAL | Status: DC
  Administered 2017-08-27: 4 mg via ORAL
  Filled 2017-08-27: qty 4

## 2017-08-27 MED ORDER — ACETAMINOPHEN 325 MG PO TABS: 650.0000 mg | ORAL_TABLET | Freq: Four times a day (QID) | ORAL | Status: DC | PRN

## 2017-08-27 MED ORDER — IOPAMIDOL (ISOVUE-370) INJECTION 76%
INTRAVENOUS | Status: AC
  Filled 2017-08-27: qty 100

## 2017-08-27 MED ORDER — IPRATROPIUM-ALBUTEROL 0.5-2.5 (3) MG/3ML IN SOLN
3.0000 mL | Freq: Four times a day (QID) | RESPIRATORY_TRACT | Status: DC
  Administered 2017-08-27 (×3): 3 mL via RESPIRATORY_TRACT
  Filled 2017-08-27 (×3): qty 3

## 2017-08-27 MED ORDER — PANTOPRAZOLE SODIUM 40 MG IV SOLR
40.0000 mg | Freq: Every day | INTRAVENOUS | Status: DC
  Administered 2017-08-27: 40 mg via INTRAVENOUS
  Filled 2017-08-27: qty 40

## 2017-08-27 MED ORDER — PREDNISONE 50 MG PO TABS
60.0000 mg | ORAL_TABLET | Freq: Every day | ORAL | Status: DC
  Administered 2017-08-27 – 2017-08-28 (×2): 60 mg via ORAL
  Filled 2017-08-27: qty 3
  Filled 2017-08-27: qty 1

## 2017-08-27 MED ORDER — LIDOCAINE 5 % EX PTCH
1.0000 | MEDICATED_PATCH | Freq: Two times a day (BID) | CUTANEOUS | Status: DC
  Administered 2017-08-27 – 2017-08-29 (×5): 1 via TRANSDERMAL
  Filled 2017-08-27 (×6): qty 1

## 2017-08-27 MED ORDER — OSELTAMIVIR PHOSPHATE 75 MG PO CAPS
75.0000 mg | ORAL_CAPSULE | Freq: Once | ORAL | Status: AC
  Administered 2017-08-27: 75 mg via ORAL
  Filled 2017-08-27: qty 1

## 2017-08-27 MED ORDER — TOPIRAMATE 100 MG PO TABS
100.0000 mg | ORAL_TABLET | Freq: Three times a day (TID) | ORAL | Status: DC
  Administered 2017-08-27 – 2017-08-28 (×4): 100 mg via ORAL
  Filled 2017-08-27 (×4): qty 1
  Filled 2017-08-27: qty 4

## 2017-08-27 MED ORDER — PREDNISONE 20 MG PO TABS
60.0000 mg | ORAL_TABLET | Freq: Once | ORAL | Status: AC
  Administered 2017-08-27: 60 mg via ORAL
  Filled 2017-08-27: qty 3

## 2017-08-27 MED ORDER — PANTOPRAZOLE SODIUM 40 MG PO TBEC: 40.0000 mg | DELAYED_RELEASE_TABLET | Freq: Every day | ORAL | Status: DC

## 2017-08-27 MED ORDER — SODIUM CHLORIDE 0.9 % IV BOLUS
1000.0000 mL | Freq: Once | INTRAVENOUS | Status: AC
  Administered 2017-08-27: 1000 mL via INTRAVENOUS

## 2017-08-27 MED ORDER — SODIUM CHLORIDE 0.9% FLUSH
3.0000 mL | Freq: Two times a day (BID) | INTRAVENOUS | Status: DC
  Administered 2017-08-27 – 2017-08-28 (×3): 3 mL via INTRAVENOUS

## 2017-08-27 MED ORDER — ACETAMINOPHEN 650 MG RE SUPP: 650.0000 mg | Freq: Four times a day (QID) | RECTAL | Status: DC | PRN

## 2017-08-27 MED ORDER — POTASSIUM CHLORIDE 10 MEQ/100ML IV SOLN
10.0000 meq | INTRAVENOUS | Status: AC
  Administered 2017-08-27 (×3): 10 meq via INTRAVENOUS
  Filled 2017-08-27 (×4): qty 100

## 2017-08-27 MED ORDER — ONDANSETRON HCL 4 MG PO TABS: 4.0000 mg | ORAL_TABLET | Freq: Four times a day (QID) | ORAL | Status: DC | PRN

## 2017-08-27 MED ORDER — SODIUM CHLORIDE 0.9 % IV SOLN
1.0000 g | Freq: Every day | INTRAVENOUS | Status: DC
  Administered 2017-08-27 – 2017-08-28 (×2): 1 g via INTRAVENOUS
  Filled 2017-08-27 (×2): qty 10

## 2017-08-27 NOTE — ED Notes (Signed)
Pt placed on 3L of oxygen. Pt snoring and leaning on side. Repositioned for better chest expansion.

## 2017-08-27 NOTE — Progress Notes (Addendum)
57 [place] [facility] [place] [facility] [place] and hypoxia as his saturation in the 80s.  His blood sugar was normal.  He is admitted with diagnosis of acute exacerbation of COPD.  He is being treated with Rocephin albuterol duo nebs, steroids and oxygen.  CT of the head was done for decreased level of consciousness which was negative.  He was taking Elavil Xanax Soma and oxycodone prior to admission which is being held at this time.  CT of the chest showed no evidence of PE but evidence of moderate emphysema.  His labs have been stable.

## 2017-08-27 NOTE — ED Notes (Signed)
Regular breakfast tray ordered.  

## 2017-08-27 NOTE — ED Notes (Signed)
Regular lunch tray ordered 
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
## 2017-08-27 NOTE — ED Notes (Signed)
Patient transported to CT 

## 2017-08-28 ENCOUNTER — Inpatient Hospital Stay (HOSPITAL_COMMUNITY)

## 2017-08-28 ENCOUNTER — Other Ambulatory Visit: Payer: Self-pay

## 2017-08-28 DIAGNOSIS — E876 Hypokalemia: Secondary | ICD-10-CM | POA: Diagnosis present

## 2017-08-28 DIAGNOSIS — J9601 Acute respiratory failure with hypoxia: Secondary | ICD-10-CM

## 2017-08-28 DIAGNOSIS — R569 Unspecified convulsions: Secondary | ICD-10-CM

## 2017-08-28 DIAGNOSIS — L8990 Pressure ulcer of unspecified site, unspecified stage: Secondary | ICD-10-CM | POA: Diagnosis present

## 2017-08-28 LAB — BASIC METABOLIC PANEL
Anion gap: 10 (ref 5–15)
BUN: 9 mg/dL (ref 6–20)
CHLORIDE: 105 mmol/L (ref 101–111)
CO2: 26 mmol/L (ref 22–32)
Calcium: 8.5 mg/dL — ABNORMAL LOW (ref 8.9–10.3)
Creatinine, Ser: 1 mg/dL (ref 0.61–1.24)
GFR calc Af Amer: >60 mL/min (ref >60)
GFR calc non Af Amer: >60 mL/min (ref >60)
GLUCOSE: 90 mg/dL (ref 65–99)
POTASSIUM: 3.2 mmol/L — AB (ref 3.5–5.1)
Sodium: 141 mmol/L (ref 135–145)

## 2017-08-28 LAB — HEPATIC FUNCTION PANEL
ALK PHOS: 78 U/L (ref 38–126)
ALT: 17 U/L (ref 17–63)
AST: 20 U/L (ref 15–41)
Albumin: 3.7 g/dL (ref 3.5–5.0)
BILIRUBIN TOTAL: 0.4 mg/dL (ref 0.3–1.2)
Total Protein: 6.5 g/dL (ref 6.5–8.1)

## 2017-08-28 LAB — CBC
HEMATOCRIT: 42.5 % (ref 39.0–52.0)
Hemoglobin: 14.1 g/dL (ref 13.0–17.0)
MCH: 29.9 pg (ref 26.0–34.0)
MCHC: 33.2 g/dL (ref 30.0–36.0)
MCV: 90.2 fL (ref 78.0–100.0)
Platelets: 157 10*3/uL (ref 150–400)
RBC: 4.71 MIL/uL (ref 4.22–5.81)
RDW: 13.8 % (ref 11.5–15.5)
WBC: 8.9 10*3/uL (ref 4.0–10.5)

## 2017-08-28 LAB — VALPROIC ACID LEVEL: Valproic Acid Lvl: 47 ug/mL — ABNORMAL LOW (ref 50.0–100.0)

## 2017-08-28 LAB — AMMONIA: Ammonia: 31 umol/L (ref 9–35)

## 2017-08-28 MED ORDER — ROPINIROLE HCL 1 MG PO TABS
1.0000 mg | ORAL_TABLET | Freq: Every day | ORAL | Status: DC
  Administered 2017-08-28: 1 mg via ORAL
  Filled 2017-08-28: qty 1

## 2017-08-28 MED ORDER — PRAZOSIN HCL 2 MG PO CAPS
2.0000 mg | ORAL_CAPSULE | Freq: Every day | ORAL | Status: DC
  Filled 2017-08-28: qty 1

## 2017-08-28 MED ORDER — DIVALPROEX SODIUM 500 MG PO DR TAB
500.0000 mg | DELAYED_RELEASE_TABLET | Freq: Two times a day (BID) | ORAL | Status: DC
  Administered 2017-08-29: 500 mg via ORAL
  Filled 2017-08-28 (×3): qty 1

## 2017-08-28 MED ORDER — LAMOTRIGINE 100 MG PO TABS: 200.0000 mg | ORAL_TABLET | Freq: Every day | ORAL | Status: DC

## 2017-08-28 MED ORDER — POTASSIUM CHLORIDE CRYS ER 20 MEQ PO TBCR
40.0000 meq | EXTENDED_RELEASE_TABLET | ORAL | Status: AC
  Administered 2017-08-28: 40 meq via ORAL
  Filled 2017-08-28: qty 2

## 2017-08-28 MED ORDER — PSYLLIUM 95 % PO PACK
1.0000 | PACK | Freq: Every day | ORAL | Status: DC
  Administered 2017-08-29: 1 via ORAL
  Filled 2017-08-28 (×2): qty 1

## 2017-08-28 MED ORDER — HYDROXYZINE HCL 25 MG PO TABS
25.0000 mg | ORAL_TABLET | Freq: Two times a day (BID) | ORAL | Status: DC
  Administered 2017-08-29: 25 mg via ORAL
  Filled 2017-08-28: qty 1

## 2017-08-28 MED ORDER — PREDNISONE 50 MG PO TABS
50.0000 mg | ORAL_TABLET | Freq: Every day | ORAL | Status: DC
  Administered 2017-08-29: 50 mg via ORAL
  Filled 2017-08-28: qty 1

## 2017-08-28 MED ORDER — IPRATROPIUM-ALBUTEROL 0.5-2.5 (3) MG/3ML IN SOLN
3.0000 mL | Freq: Two times a day (BID) | RESPIRATORY_TRACT | Status: DC
  Administered 2017-08-28 – 2017-08-29 (×2): 3 mL via RESPIRATORY_TRACT
  Filled 2017-08-28 (×2): qty 3

## 2017-08-28 MED ORDER — OLANZAPINE 10 MG PO TABS
20.0000 mg | ORAL_TABLET | Freq: Every day | ORAL | Status: DC
  Filled 2017-08-28: qty 2

## 2017-08-28 MED ORDER — MIRTAZAPINE 7.5 MG PO TABS: 15.0000 mg | ORAL_TABLET | Freq: Every day | ORAL | Status: DC
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
## 2017-08-28 NOTE — Progress Notes (Signed)
Called pharmacy about pt's medication: Depakote, Vistaril, Lamictal, Remeron, Zyprexa, and Minipress.  Per pharmacy medication have not been approved yet.  Unclear if pt actually takes these medication at home.  Unable to give.  Karena Addisonoro, Adrian Dinovo T

## 2017-08-28 NOTE — Progress Notes (Addendum)
[facility] Progress Note  Patient: Cody Blake WUJ:[id number]   PCP: Vivien Presto, MD DOB: Aug 09, 1960   DOA: 08/26/2017   DOS: 08/28/2017   Date of Service: the patient was seen and examined on 08/28/2017  Subjective: The patient does not have any seizure episode here in the hospital.  Has some tremors.  Denies any nausea or vomiting.  Complains about some abdominal pain.  No diarrhea or constipation.  No fever no chills.  Brief hospital course: Pt. with PMH of substance abuse, depression; admitted on 08/26/2017, presented with complaint of frequent fall and seizure-like episodes, was found to have acute hypoxic respiratory failure and was diagnosed with a possible COPD exacerbation. Currently further plan is continue further workup.  Assessment and Plan: 1.  Acute hypoxic respiratory failure. ?  COPD exacerbation. Patient was hypoxic on arrival and it has been mentioned that the patient has COPD exacerbation on admission.  Influenza PCR is negative. Chest x-ray is negative. Currently on room air. Will taper the steroids rapidly as well as change him to as needed nebulizer. Do not think the patient needs antibiotic at present.  2.  Acute encephalopathy. ?  Toxic.  Drug-induced Seizure-like episodes. Frequent fall. ? malingering.  Patient does not have any significant focal deficit on examination. No seizures since arrival to hospital. Med rec  performed after consulting with RN from the prison. Appears that the patient is on multiple antiseizure medications including Depakote 500 mg twice daily, Lamictal also other antipsychotic medications. We currently resume this medication and monitor. Get MRI brain with and without contrast Check EEG. Monitor on telemetry for now. History of the seizure has been reported by the staff at the prison, do not think that the patient actually had seizures.  3. Hypokalemia Replaced  Monitor   4.  Tremors. Restless leg  syndrome. Patient appears to be started on Requip 4 mg daily thinking that he was on this medication prior to arrival although on med rec performed by myself with the RN at the prison patient is not on this medication. Patient would probably benefit from being on Requip and therefore will start him on 1 mg nightly dose.  Diet: Cardiac diet DVT Prophylaxis: subcutaneous Heparin  Advance goals of care discussion: Full code  Family Communication: no family was present at bedside, at the time of interview.   Disposition:  Discharge back to prison probably tomorrow pending EEG.  Consultants: none Procedures: EEG  Antibiotics: Anti-infectives (From admission, onward)   Start     Dose/Rate Route Frequency Ordered Stop   08/27/17 0600  cefTRIAXone (ROCEPHIN) 1 g in sodium chloride 0.9 % 100 mL IVPB  Status:  Discontinued     1 g 200 mL/hr over 30 Minutes Intravenous Daily 08/27/17 0528 08/28/17 0945   08/27/17 0315  oseltamivir (TAMIFLU) capsule 75 mg     75 mg Oral  Once 08/27/17 0306 08/27/17 0323       Objective: Physical Exam: Vitals:   08/28/17 0753 08/28/17 0917 08/28/17 1122 08/28/17 1514  BP: (!) 101/56  124/60 108/68  Pulse: 68 78 86 92  Resp: 16 20 18 20   Temp: 98.1 F (36.7 C)  98.5 F (36.9 C) 99 F (37.2 C)  TempSrc: Oral  Oral Oral  SpO2: 93% 93% 96% 93%  Weight:      Height:        Intake/Output Summary (Last 24 hours) at 08/28/2017 1524 Last data filed at 08/28/2017 1500 Gross per 24 hour  Intake 1200  ml  Output 2200 ml  Net -1000 ml   Filed Weights   08/27/17 0006 08/27/17 1500 08/28/17 0255  Weight: 67.1 kg (148 lb) 75.7 kg (166 lb 14.2 oz) 79.1 kg (174 lb 6.1 oz)   General: Alert, Awake and Oriented to Time, Place and Person. Appear in mild distress, affect blunted Eyes: PERRL, Conjunctiva normal ENT: Oral Mucosa clear moist. Neck: no JVD, no Abnormal Mass Or lumps Cardiovascular: S1 and S2 Present, no Murmur, Peripheral Pulses  Present Respiratory: normal respiratory effort, Bilateral Air entry equal and Decreased, no use of accessory muscle, Clear to Auscultation, no Crackles, no wheezes Abdomen: Bowel Sound present, Soft and no tenderness, no hernia Skin: no redness, no Rash, no induration Extremities: no Pedal edema, no calf tenderness Neurologic: Grossly no focal neuro deficit. Bilaterally Equal motor strength  Data Reviewed: CBC: Recent Labs  Lab 08/27/17 0052 08/27/17 0620 08/28/17 0257  WBC 6.7 6.6 8.9  NEUTROABS 4.5  --   --   HGB 15.2 14.4 14.1  HCT 45.0 43.1 42.5  MCV 88.6 88.9 90.2  PLT 163 155 157   Basic Metabolic Panel: Recent Labs  Lab 08/27/17 0052 08/27/17 0620 08/28/17 0257  NA 140  --  141  K 3.0*  --  3.2*  CL 99*  --  105  CO2 28  --  26  GLUCOSE 128*  --  90  BUN 8  --  9  CREATININE 1.02 0.98 1.00  CALCIUM 10.6*  --  8.5*  MG  --  1.7  --   PHOS  --  2.8  --     Liver Function Tests: Recent Labs  Lab 08/27/17 0052  AST 19  ALT 17  ALKPHOS 78  BILITOT 0.7  PROT 7.0  ALBUMIN 3.9   Recent Labs  Lab 08/27/17 0052  LIPASE 24   No results for input(s): AMMONIA in the last 168 hours. Coagulation Profile: No results for input(s): INR, PROTIME in the last 168 hours. Cardiac Enzymes: No results for input(s): CKTOTAL, CKMB, CKMBINDEX, TROPONINI in the last 168 hours. BNP (last 3 results) No results for input(s): PROBNP in the last 8760 hours. CBG: No results for input(s): GLUCAP in the last 168 hours. Studies: No results found.  Scheduled Meds: . divalproex  500 mg Oral BID  . enoxaparin (LOVENOX) injection  40 mg Subcutaneous Q24H  . hydrOXYzine  25 mg Oral BID  . ipratropium-albuterol  3 mL Nebulization BID  . lamoTRIgine  200 mg Oral QHS  . lidocaine  1 patch Transdermal Q12H  . mirtazapine  15 mg Oral QHS  . OLANZapine  20 mg Oral QHS  . pantoprazole  40 mg Oral Daily  . prazosin  2 mg Oral QHS  . [START ON 08/29/2017] predniSONE  50 mg Oral Q  breakfast  . psyllium  1 packet Oral Daily  . rOPINIRole  4 mg Oral QHS  . sodium chloride flush  3 mL Intravenous Q12H  . topiramate  100 mg Oral TID   Continuous Infusions: PRN Meds: acetaminophen **OR** acetaminophen, albuterol, LORazepam, ondansetron **OR** ondansetron (ZOFRAN) IV  Time spent: 35 minutes  Author: Lynden OxfordPranav Grenda Lora, MD [facility] Pager: [phone] 08/28/2017 3:24 PM  If 7PM-7AM, please contact night-coverage at [email or web address], password [email or web address]

## 2017-08-29 ENCOUNTER — Inpatient Hospital Stay (HOSPITAL_COMMUNITY)

## 2017-08-29 DIAGNOSIS — R569 Unspecified convulsions: Secondary | ICD-10-CM

## 2017-08-29 LAB — BASIC METABOLIC PANEL
Anion gap: 12 (ref 5–15)
BUN: 10 mg/dL (ref 6–20)
CO2: 21 mmol/L — ABNORMAL LOW (ref 22–32)
Calcium: 8.8 mg/dL — ABNORMAL LOW (ref 8.9–10.3)
Chloride: 106 mmol/L (ref 101–111)
Creatinine, Ser: 1.14 mg/dL (ref 0.61–1.24)
GFR calc Af Amer: >60 mL/min (ref >60)
GFR calc non Af Amer: >60 mL/min (ref >60)
Glucose, Bld: 127 mg/dL — ABNORMAL HIGH (ref 65–99)
Potassium: 3.9 mmol/L (ref 3.5–5.1)
Sodium: 139 mmol/L (ref 135–145)

## 2017-08-29 MED ORDER — ALBUTEROL SULFATE HFA 108 (90 BASE) MCG/ACT IN AERS: 2.0000 | INHALATION_SPRAY | Freq: Four times a day (QID) | RESPIRATORY_TRACT | 0 refills | Status: AC | PRN

## 2017-08-29 MED ORDER — ROPINIROLE HCL 1 MG PO TABS: 1.0000 mg | ORAL_TABLET | Freq: Every day | ORAL | 0 refills | Status: AC

## 2017-08-29 MED ORDER — PREDNISONE 10 MG PO TABS: ORAL_TABLET | ORAL | 0 refills | Status: AC

## 2017-08-29 NOTE — Procedures (Signed)
HPI:  57 y/o with seizure like activity  TECHNICAL SUMMARY:  A multichannel referential and bipolar montage EEG using the standard international 10-20 system was performed on the patient described as awake.  The dominant background activity consists of 9 hertz activity seen most prominantly over the posterior head region.  The backgound activity is reactive to eye opening and closing procedures.  Low voltage fast (beta) activity is distributed symmetrically and maximally over the anterior head regions.  ACTIVATION:  Stepwise photic stimulation at 4-20 flashes per second was performed and did not elicit any abnormal waveforms but did produce a symmetric driving response.  Hyperventilation was not performed.  EPILEPTIFORM ACTIVITY:  There were no spikes, sharp waves or paroxysmal activity.  SLEEP: Brief physiologic drowsiness noted, but no stage II sleep.  CARDIAC:  The EKG lead is not well recorded.  IMPRESSION:  This is a normal EEG for the patients stated age.  There were no focal, hemispheric or lateralizing features.  No epileptiform activity was recorded.  A normal EEG does not exclude the diagnosis of a seizure disorder and if seizure remains high on the list of differential diagnosis, an ambulatory EEG may be of value.  Clinical correlation is required.
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
## 2017-08-29 NOTE — Progress Notes (Signed)
EEG completed, results pending. 

## 2017-08-29 NOTE — Care Management (Addendum)
[facility]  [facility] will be back [facility].  CM spoke with DON Tonya; DON requested that upon discharge that summary be faxed to  [phone] - bedside nurse to fax once summary is available.  Additionally, a copy of the AVS should be placed in the discharge packet at discharge to be transported with prisoner back to jail.

## 2017-08-29 NOTE — Progress Notes (Signed)
[facility], discharged instructions and prescription given to the guards.belongings with the pt.
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
## 2017-11-10 ENCOUNTER — Emergency Department (HOSPITAL_COMMUNITY)
Admission: EM | Admit: 2017-11-10 | Discharge: 2017-11-10 | Attending: Emergency Medicine | Admitting: Emergency Medicine

## 2017-11-10 ENCOUNTER — Encounter (HOSPITAL_COMMUNITY): Payer: Self-pay | Admitting: Emergency Medicine

## 2017-11-10 DIAGNOSIS — R569 Unspecified convulsions: Secondary | ICD-10-CM | POA: Diagnosis not present

## 2017-11-10 DIAGNOSIS — Z79899 Other long term (current) drug therapy: Secondary | ICD-10-CM | POA: Insufficient documentation

## 2017-11-10 DIAGNOSIS — Z96652 Presence of left artificial knee joint: Secondary | ICD-10-CM | POA: Diagnosis not present

## 2017-11-10 LAB — RAPID URINE DRUG SCREEN, HOSP PERFORMED
AMPHETAMINES: NOT DETECTED
BENZODIAZEPINES: NOT DETECTED
Cocaine: NOT DETECTED
OPIATES: NOT DETECTED
TETRAHYDROCANNABINOL: NOT DETECTED

## 2017-11-10 LAB — CBC WITH DIFFERENTIAL/PLATELET
BASOS ABS: 0 10*3/uL (ref 0.0–0.1)
BASOS PCT: 0 %
EOS ABS: 0 10*3/uL (ref 0.0–0.7)
EOS PCT: 1 %
HCT: 45.8 % (ref 39.0–52.0)
Hemoglobin: 15.8 g/dL (ref 13.0–17.0)
LYMPHS PCT: 25 %
Lymphs Abs: 1.6 10*3/uL (ref 0.7–4.0)
MCH: 32.7 pg (ref 26.0–34.0)
MCHC: 34.5 g/dL (ref 30.0–36.0)
MCV: 94.8 fL (ref 78.0–100.0)
MONO ABS: 0.9 10*3/uL (ref 0.1–1.0)
Monocytes Relative: 15 %
Neutro Abs: 3.6 10*3/uL (ref 1.7–7.7)
Neutrophils Relative %: 59 %
PLATELETS: 174 10*3/uL (ref 150–400)
RBC: 4.83 MIL/uL (ref 4.22–5.81)
RDW: 15.7 % — ABNORMAL HIGH (ref 11.5–15.5)
WBC: 6.2 10*3/uL (ref 4.0–10.5)

## 2017-11-10 LAB — ETHANOL: Alcohol, Ethyl (B): <10 mg/dL (ref <10)

## 2017-11-10 LAB — I-STAT CHEM 8, ED
BUN: 21 mg/dL — AB (ref 6–20)
CALCIUM ION: 1.24 mmol/L (ref 1.15–1.40)
Chloride: 102 mmol/L (ref 98–111)
Creatinine, Ser: 0.9 mg/dL (ref 0.61–1.24)
Glucose, Bld: 122 mg/dL — ABNORMAL HIGH (ref 70–99)
HCT: 45 % (ref 39.0–52.0)
HEMOGLOBIN: 15.3 g/dL (ref 13.0–17.0)
Potassium: 3.5 mmol/L (ref 3.5–5.1)
SODIUM: 143 mmol/L (ref 135–145)
TCO2: 28 mmol/L (ref 22–32)

## 2017-11-10 LAB — PHENYTOIN LEVEL, TOTAL: Phenytoin Lvl: <2.5 ug/mL — ABNORMAL LOW (ref 10.0–20.0)

## 2017-11-10 LAB — CBG MONITORING, ED: GLUCOSE-CAPILLARY: 118 mg/dL — AB (ref 70–99)

## 2017-11-10 LAB — VALPROIC ACID LEVEL: Valproic Acid Lvl: 79 ug/mL (ref 50.0–100.0)

## 2017-11-10 MED ORDER — ACETAMINOPHEN 500 MG PO TABS
1000.0000 mg | ORAL_TABLET | Freq: Once | ORAL | Status: AC
  Administered 2017-11-10: 1000 mg via ORAL
  Filled 2017-11-10: qty 2

## 2017-11-10 NOTE — Discharge Instructions (Signed)
Take your prescribed medications.  Do not miss any doses.  Given your history of seizure-like activity, work-up was performed in the ED.  This was reassuring.  You had a normal head CT and EEG during your hospitalization in April.  You require ongoing follow-up by a neurologist on an outpatient basis.  Have the jail call to coordinate an appointment with a neurologist.  You may return for any new or concerning symptoms.
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
## 2017-11-10 NOTE — ED Notes (Signed)
Bed: [id number] Expected date:  Expected time:  Means of arrival:  Comments: Seizure from the jail

## 2017-11-11 LAB — LAMOTRIGINE LEVEL: LAMOTRIGINE LVL: 22.8 ug/mL — AB (ref 2.0–20.0)

## 2017-11-20 ENCOUNTER — Emergency Department (HOSPITAL_COMMUNITY)
Admission: EM | Admit: 2017-11-20 | Discharge: 2017-11-20 | Attending: Emergency Medicine | Admitting: Emergency Medicine

## 2017-11-20 ENCOUNTER — Encounter (HOSPITAL_COMMUNITY): Payer: Self-pay | Admitting: *Deleted

## 2017-11-20 ENCOUNTER — Other Ambulatory Visit: Payer: Self-pay

## 2017-11-20 DIAGNOSIS — R319 Hematuria, unspecified: Secondary | ICD-10-CM | POA: Diagnosis not present

## 2017-11-20 DIAGNOSIS — R300 Dysuria: Secondary | ICD-10-CM

## 2017-11-20 DIAGNOSIS — Z96652 Presence of left artificial knee joint: Secondary | ICD-10-CM | POA: Insufficient documentation

## 2017-11-20 LAB — URINALYSIS, ROUTINE W REFLEX MICROSCOPIC
BACTERIA UA: NONE SEEN
Bilirubin Urine: NEGATIVE
Glucose, UA: NEGATIVE mg/dL
Ketones, ur: NEGATIVE mg/dL
Nitrite: NEGATIVE
Protein, ur: 100 mg/dL — AB
RBC / HPF: >50 RBC/hpf — ABNORMAL HIGH (ref 0–5)
SPECIFIC GRAVITY, URINE: 1.021 (ref 1.005–1.030)
pH: 7 (ref 5.0–8.0)

## 2017-11-20 MED ORDER — CEPHALEXIN 500 MG PO CAPS
500.0000 mg | ORAL_CAPSULE | Freq: Once | ORAL | Status: AC
  Administered 2017-11-20: 500 mg via ORAL
  Filled 2017-11-20: qty 1

## 2017-11-20 MED ORDER — CEPHALEXIN 500 MG PO CAPS: 500.0000 mg | ORAL_CAPSULE | Freq: Four times a day (QID) | ORAL | 0 refills | Status: AC

## 2017-11-20 NOTE — ED Notes (Signed)
Bed: [id number] Expected date:  Expected time:  Means of arrival:  Comments: 57 yo hematuria( from jail)
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
## 2017-11-20 NOTE — ED Triage Notes (Signed)
Pt states he noted bloody looking drainage from penis in his urinary incont undergarment, states it was very light then became darker in color, states also initially noted this approx 30 min ago

## 2017-11-20 NOTE — ED Notes (Signed)
Pt attempted to provide urine specimen but was only able to void about 4 cc
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
## 2017-11-20 NOTE — ED Notes (Signed)
[facility] HIPAA approved voice mail left with call back number for nursing staff if there are any questions.
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
## 2017-11-20 NOTE — ED Notes (Signed)
Bladder scan completed, reading highest at 50 ml. 0ml reading x4.

## 2017-11-20 NOTE — ED Notes (Signed)
Pt informed on need for urine sample. Pt has urinal at bedside. Officers at bedside.

## 2017-11-20 NOTE — Discharge Instructions (Signed)
You were seen in the ED today with blood in the urine. I am starting an antibiotic to take as prescribed but you will require evaluation in the clinic by a Urologist. Call to schedule an appointment in the coming week. Return to the ED if you are suddenly unable to urinate at all, you develop severe pain, or if you develop fever.

## 2017-11-20 NOTE — ED Notes (Signed)
Pt has urinal at bedside and made aware of need for urine specimen °

## 2017-11-22 LAB — URINE CULTURE: Culture: NO GROWTH

## 2017-11-30 ENCOUNTER — Emergency Department (HOSPITAL_COMMUNITY)
Admission: EM | Admit: 2017-11-30 | Discharge: 2017-11-30 | Disposition: A | Discharge status: Home / Self Care | Attending: Emergency Medicine | Admitting: Emergency Medicine

## 2017-11-30 ENCOUNTER — Encounter (HOSPITAL_COMMUNITY): Payer: Self-pay

## 2017-11-30 ENCOUNTER — Other Ambulatory Visit: Payer: Self-pay

## 2017-11-30 DIAGNOSIS — Z79899 Other long term (current) drug therapy: Secondary | ICD-10-CM | POA: Diagnosis not present

## 2017-11-30 DIAGNOSIS — R251 Tremor, unspecified: Secondary | ICD-10-CM | POA: Insufficient documentation

## 2017-11-30 DIAGNOSIS — G252 Other specified forms of tremor: Secondary | ICD-10-CM

## 2017-11-30 DIAGNOSIS — K649 Unspecified hemorrhoids: Secondary | ICD-10-CM

## 2017-11-30 DIAGNOSIS — R195 Other fecal abnormalities: Secondary | ICD-10-CM | POA: Diagnosis present

## 2017-11-30 DIAGNOSIS — K625 Hemorrhage of anus and rectum: Secondary | ICD-10-CM | POA: Diagnosis not present

## 2017-11-30 LAB — CBC
HCT: 47.1 % (ref 39.0–52.0)
HEMOGLOBIN: 16 g/dL (ref 13.0–17.0)
MCH: 32.7 pg (ref 26.0–34.0)
MCHC: 34 g/dL (ref 30.0–36.0)
MCV: 96.3 fL (ref 78.0–100.0)
Platelets: 222 10*3/uL (ref 150–400)
RBC: 4.89 MIL/uL (ref 4.22–5.81)
RDW: 14.9 % (ref 11.5–15.5)
WBC: 5.5 10*3/uL (ref 4.0–10.5)

## 2017-11-30 LAB — COMPREHENSIVE METABOLIC PANEL
ALT: 23 U/L (ref 0–44)
ANION GAP: 13 (ref 5–15)
AST: 30 U/L (ref 15–41)
Albumin: 3.7 g/dL (ref 3.5–5.0)
Alkaline Phosphatase: 61 U/L (ref 38–126)
BILIRUBIN TOTAL: 0.5 mg/dL (ref 0.3–1.2)
BUN: 16 mg/dL (ref 6–20)
CALCIUM: 9.6 mg/dL (ref 8.9–10.3)
CO2: 30 mmol/L (ref 22–32)
Chloride: 100 mmol/L (ref 98–111)
Creatinine, Ser: 1.23 mg/dL (ref 0.61–1.24)
Glucose, Bld: 179 mg/dL — ABNORMAL HIGH (ref 70–99)
Potassium: 3.4 mmol/L — ABNORMAL LOW (ref 3.5–5.1)
Sodium: 143 mmol/L (ref 135–145)
TOTAL PROTEIN: 7 g/dL (ref 6.5–8.1)

## 2017-11-30 LAB — PROTIME-INR
INR: 0.98
Prothrombin Time: 12.9 seconds (ref 11.4–15.2)

## 2017-11-30 LAB — TYPE AND SCREEN
ABO/RH(D): B POS
ANTIBODY SCREEN: NEGATIVE

## 2017-11-30 LAB — VALPROIC ACID LEVEL: VALPROIC ACID LVL: 79 ug/mL (ref 50.0–100.0)

## 2017-11-30 MED ORDER — DOCUSATE SODIUM 100 MG PO CAPS: 100.0000 mg | ORAL_CAPSULE | Freq: Two times a day (BID) | ORAL | 0 refills | Status: AC

## 2017-11-30 NOTE — ED Triage Notes (Addendum)
Pt sent from jail. They report bloody stools and LLQ abdominal pain starting 2 days ago. He also reports that he has bloody discharge coming from his penis as well. A&Ox4 at baseline. Detention officers at bedside.

## 2017-11-30 NOTE — ED Notes (Signed)
Spoke with lab, will add on Valproic acid level to existing blood work.

## 2017-11-30 NOTE — Discharge Instructions (Addendum)
1.  Hemorrhoids can be made worse by constipation and straining at stool.  Take Colace twice daily to help soften stool, eat a high-fiber diet and stay hydrated. 2.  Patient has concern for tremors.  These can be evaluated by his primary care doctor and referral to neurology as needed.  No acute findings that require emergency department evaluation for tremors.  Patient's basic labs are within normal limits and his Depakote level is therapeutic.
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
## 2017-12-01 LAB — ABO/RH: ABO/RH(D): B POS

## 2017-12-01 LAB — POC OCCULT BLOOD, ED: Fecal Occult Bld: POSITIVE — AB
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
